# Patient Record
Sex: Female | Born: 1990
Health system: Southern US, Community
[De-identification: ages and names within clinical notes are randomized; demographics above are authoritative.]

## PROBLEM LIST (undated history)

## (undated) DIAGNOSIS — R519 Headache, unspecified: Secondary | ICD-10-CM

## (undated) DIAGNOSIS — F419 Anxiety disorder, unspecified: Secondary | ICD-10-CM

## (undated) DIAGNOSIS — F32A Depression, unspecified: Secondary | ICD-10-CM

## (undated) DIAGNOSIS — M549 Dorsalgia, unspecified: Secondary | ICD-10-CM

## (undated) DIAGNOSIS — E7800 Pure hypercholesterolemia, unspecified: Secondary | ICD-10-CM

## (undated) DIAGNOSIS — F988 Other specified behavioral and emotional disorders with onset usually occurring in childhood and adolescence: Secondary | ICD-10-CM

## (undated) DIAGNOSIS — F329 Major depressive disorder, single episode, unspecified: Secondary | ICD-10-CM

## (undated) DIAGNOSIS — R51 Headache: Secondary | ICD-10-CM

## (undated) HISTORY — DX: Dorsalgia, unspecified: M54.9

## (undated) HISTORY — DX: Anxiety disorder, unspecified: F41.9

## (undated) HISTORY — DX: Other specified behavioral and emotional disorders with onset usually occurring in childhood and adolescence: F98.8

## (undated) HISTORY — DX: Depression, unspecified: F32.A

## (undated) HISTORY — DX: Headache: R51

## (undated) HISTORY — DX: Pure hypercholesterolemia, unspecified: E78.00

## (undated) HISTORY — DX: Headache, unspecified: R51.9

## (undated) HISTORY — PX: TONSILLECTOMY: SUR1361

## (undated) HISTORY — PX: WISDOM TOOTH EXTRACTION: SHX21

---
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
## 2011-11-14 ENCOUNTER — Ambulatory Visit: Payer: BC Managed Care – PPO | Attending: Family Medicine | Admitting: Audiology

## 2011-11-14 DIAGNOSIS — F802 Mixed receptive-expressive language disorder: Secondary | ICD-10-CM | POA: Insufficient documentation
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
## 2012-04-04 ENCOUNTER — Encounter: Payer: Self-pay | Admitting: Internal Medicine

## 2012-04-04 ENCOUNTER — Ambulatory Visit (INDEPENDENT_AMBULATORY_CARE_PROVIDER_SITE_OTHER): Payer: BC Managed Care – PPO | Admitting: Internal Medicine

## 2012-04-04 VITALS — BP 117/77 | HR 69 | Temp 97.8°F | Ht 68.0 in | Wt 175.0 lb

## 2012-04-04 DIAGNOSIS — Z789 Other specified health status: Secondary | ICD-10-CM

## 2012-04-04 DIAGNOSIS — Z23 Encounter for immunization: Secondary | ICD-10-CM

## 2012-04-04 MED ORDER — TYPHOID VACCINE PO CPDR: 1.0000 | DELAYED_RELEASE_CAPSULE | ORAL | Status: DC

## 2012-04-04 NOTE — Progress Notes (Signed)
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 She is unsure of the itinerary if she will be in rural areas. 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  Prior int travel: [place]   No current outpatient prescriptions on file prior to visit.   Active Ambulatory Problems    Diagnosis Date Noted  . No Active Ambulatory Problems   Resolved Ambulatory Problems    Diagnosis Date Noted  . No Resolved Ambulatory Problems   No Additional Past Medical History  - s/p tonsillectomy - s/p right ankle surgery from fracture - s/p wisdom teeth extraction  Review of Systems     Objective:   Physical Exam BP 117/77  Pulse 69  Temp 97.8 F (36.6 C) (Oral)  Ht 5\' 8"  (1.727 m)  Wt 175 lb (79.379 kg)  BMI 26.61 kg/m2  LMP 03/12/2012       Assessment & Plan:  Today: hep a vax #2(previously had received 1 dose in 2006) , and influenza. Will give typhoid vaccine rx. Will check hep a serology to see if she needs booster of 2nd dose in May  Depending on her itinerary, she will consider rabies in may.  Provided pre-travel counseling  Traveller's diarrhea: gave out preventive sheet, will give rx for ciprofloxacin 500mg  BID #10 in May  rtc in early may   Contact info: [email or web address] and ([email or web address]

## 2012-04-05 NOTE — Progress Notes (Signed)
Patient ID: Tamara Meza, female   DOB: 03-10-1991, 21 y.o.   MRN: [id number]  Will have her send [place] her itinerary to determine how many weeks of chloroquine she will need for malaria prophylaxis. Gave precautions to minimize mosquito bites.

## 2012-04-05 NOTE — Progress Notes (Signed)
  Subjective:    Patient ID: Tamara Meza, female    DOB: 17-Dec-1990, 21 y.o.   MRN: [id number]  HPI    Review of Systems     Objective:   Physical Exam        Assessment & Plan:

## 2012-08-28 ENCOUNTER — Ambulatory Visit (INDEPENDENT_AMBULATORY_CARE_PROVIDER_SITE_OTHER): Payer: BC Managed Care – PPO | Admitting: Internal Medicine

## 2012-08-28 ENCOUNTER — Encounter: Payer: Self-pay | Admitting: Internal Medicine

## 2012-08-28 VITALS — BP 118/76 | HR 73 | Temp 98.4°F | Ht 68.0 in | Wt 180.0 lb

## 2012-08-28 DIAGNOSIS — Z789 Other specified health status: Secondary | ICD-10-CM

## 2012-08-28 MED ORDER — CHLOROQUINE PHOSPHATE 250 MG PO TABS: 500.0000 mg | ORAL_TABLET | ORAL | Status: DC

## 2012-08-28 NOTE — Progress Notes (Signed)
[facility] Subjective:    Patient ID: Tamara Meza, female    DOB: 03-09-1991, 22 y.o.   MRN: [id number]  HPI Tamara Meza is Tamara Meza who will be going to [place] ([place], [place]) for 2 months. She previously received her travel vaccines. Here to see if needs 2nd hep A and malaria prophylaxis.  We discussed options, and she is going to go with chloroquine weekly dosing    Review of Systems     Objective:   Physical Exam         Assessment & Plan:  Malaria proph = chloroquine weekly dosing to start 2 wk prior to leaving, weekly thru 4 wks after return  Hep A = will check hep A ab. To see if needs an additional dose

## 2012-08-29 ENCOUNTER — Telehealth: Payer: Self-pay | Admitting: *Deleted

## 2012-08-29 NOTE — Telephone Encounter (Signed)
Called patient, was unavailable, spoke with her mother.  Notified her that the hepatitis titer came back positive, and that her second booster was unnecessary at this time. Pt's mother verbalized understanding. Pt's mother given information regarding the antimalarial prescription; she asked that the physical script along with her vaccination card be given to patient's sister when she comes 08/30/12 for a vaccination.   Andree Coss, RN

## 2013-11-18 ENCOUNTER — Encounter: Payer: Self-pay | Admitting: Obstetrics & Gynecology

## 2013-11-18 ENCOUNTER — Ambulatory Visit (INDEPENDENT_AMBULATORY_CARE_PROVIDER_SITE_OTHER): Payer: BC Managed Care – PPO | Admitting: Obstetrics & Gynecology

## 2013-11-18 VITALS — BP 122/78 | HR 68 | Resp 16 | Ht 68.0 in | Wt 183.8 lb

## 2013-11-18 DIAGNOSIS — Z30011 Encounter for initial prescription of contraceptive pills: Secondary | ICD-10-CM

## 2013-11-18 DIAGNOSIS — Z202 Contact with and (suspected) exposure to infections with a predominantly sexual mode of transmission: Secondary | ICD-10-CM

## 2013-11-18 DIAGNOSIS — Z3009 Encounter for other general counseling and advice on contraception: Secondary | ICD-10-CM

## 2013-11-18 DIAGNOSIS — B009 Herpesviral infection, unspecified: Secondary | ICD-10-CM

## 2013-11-18 MED ORDER — NORETHIN ACE-ETH ESTRAD-FE 1-20 MG-MCG PO TABS: 1.0000 | ORAL_TABLET | Freq: Every day | ORAL | Status: DC

## 2013-11-18 MED ORDER — VALACYCLOVIR HCL 500 MG PO TABS: 500.0000 mg | ORAL_TABLET | Freq: Every day | ORAL | Status: DC

## 2013-11-18 NOTE — Progress Notes (Signed)
Subjective:     Patient ID: Tamara Meza, female   DOB: May 19, 1990, 23 y.o.   MRN: [id number]  HPI 23 yo G0 SWF here to discuss two issues.  First, boyfriend had recurrent facial fever blisters.  Pt had significant outbreak of mouth sores that started with a sore throat 10/15/13.  Symptoms worsened and she out the outbreak of sores in her mouth.  Pt was so uncomfortable she went to Urgent Care on 10/19/13 and followed up with her PCP, Dr. Hyman HopesWebb, on 10/22/13.  This was swabbed and was HSV 1.  Treated with Valtrex 1 gm bid x 10 days.  Pt has not had any vulvar symptoms.    Reports significant other has been HIV tested but not necessarily full STD.  Has been SA x 1 in addition to oral sex.  Contemplating birth control and would like to talk about this.  Feels like pills is the best option for her.  Other methods discussed including Nuva ring, Depo Provera, IUD, and Implanon.   Review of Systems  All other systems reviewed and are negative.      Objective:   Physical Exam  Constitutional: She is oriented to person, place, and time. She appears well-developed and well-nourished.  Neurological: She is alert and oriented to person, place, and time.  Skin: Skin is warm and dry.  Psychiatric: She has a normal mood and affect.       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     Plan:     GC/Chl testing today Valtrex 500mg  daily for suppression  Starting loestrin 1/20 this week.  Directions for use given.  Risks discussed with pt in detail including DUB, DVT/PE, headache, nausea, increased BP.  Instruction sheet provided and reviewed with pt when to start and what to do if misses pill/pills.  Lengthy discussion with pt.  ~30 minutes spent with patient >50% of time was in face to face discussion of above.

## 2013-11-19 LAB — GC/CHLAMYDIA PROBE AMP, URINE
CHLAMYDIA, SWAB/URINE, PCR: NEGATIVE
GC PROBE AMP, URINE: NEGATIVE

## 2013-11-24 ENCOUNTER — Telehealth: Payer: Self-pay

## 2013-11-24 NOTE — Telephone Encounter (Signed)
Lmtcb//kn 

## 2013-11-24 NOTE — Telephone Encounter (Signed)
Message copied by Elisha HeadlandNIX, Lener Ventresca S on Mon Nov 24, 2013  9:27 AM ------      Message from: Jerene BearsMILLER, MARY S      Created: Wed Nov 19, 2013 12:07 PM       Inform GC and Chl are both negative. ------

## 2013-11-25 NOTE — Telephone Encounter (Signed)
Patient notified of all results.//kn 

## 2014-03-11 ENCOUNTER — Encounter: Payer: Self-pay | Admitting: Obstetrics & Gynecology

## 2014-03-11 ENCOUNTER — Ambulatory Visit (INDEPENDENT_AMBULATORY_CARE_PROVIDER_SITE_OTHER): Payer: BC Managed Care – PPO | Admitting: Obstetrics & Gynecology

## 2014-03-11 VITALS — BP 116/64 | HR 80 | Resp 16 | Ht 68.25 in | Wt 188.8 lb

## 2014-03-11 DIAGNOSIS — Z124 Encounter for screening for malignant neoplasm of cervix: Secondary | ICD-10-CM

## 2014-03-11 DIAGNOSIS — Z01419 Encounter for gynecological examination (general) (routine) without abnormal findings: Secondary | ICD-10-CM

## 2014-03-11 MED ORDER — NORETHINDRONE ACET-ETHINYL EST 1.5-30 MG-MCG PO TABS: 1.0000 | ORAL_TABLET | Freq: Every day | ORAL | Status: DC

## 2014-03-11 NOTE — Progress Notes (Signed)
23 y.o. G0P0000 SingleCaucasianF here for annual exam.  Started OCPs in September.  On Junel.  On second week in pack, has bleeding for 5-6 days.  Then on placebo pills will have very light bleeding for about three days.  Cramping is minimal.  Will sometimes have some back pain with cycle but this was present before she started the pill.    STD testing in August was negative.  Has received Gardisil.  Patient's last menstrual period was 03/04/2014.          Sexually active: No.  The current method of family planning is OCP (estrogen/progesterone).    Exercising: Yes.    various Smoker:  no  Health Maintenance: Pap:  none History of abnormal Pap:  no MMG:  none Colonoscopy:  none BMD:   none TDaP:  2010 Screening Labs: not today, Hb today: declined, Urine today: declined   reports that she has never smoked. She has never used smokeless tobacco. She reports that she drinks about 0.6 - 1.2 oz of alcohol per week. She reports that she does not use illicit drugs.  Past Medical History  Diagnosis Date  . Headache     Past Surgical History  Procedure Laterality Date  . Tonsillectomy    . Ankle surgery  2008    right    Current Outpatient Prescriptions  Medication Sig Dispense Refill  . amphetamine-dextroamphetamine (ADDERALL XR) 25 MG 24 hr capsule   0  . norethindrone-ethinyl estradiol (JUNEL FE,GILDESS FE,LOESTRIN FE) 1-20 MG-MCG tablet Take 1 tablet by mouth daily. 1 Package 4  . valACYclovir (VALTREX) 500 MG tablet Take 1 tablet (500 mg total) by mouth daily. 30 tablet 13   No current facility-administered medications for this visit.    Family History  Problem Relation Age of Onset  . Diabetes Father   . Hypertension Father   . Heart disease Mother   . Infertility Paternal Grandmother   . Infertility Paternal Aunt   . Varicose Veins Maternal Grandmother   . Anxiety disorder Mother   . Anxiety disorder Other     maternal side  . Anxiety disorder Paternal Aunt      ROS:  Pertinent items are noted in HPI.  Otherwise, a comprehensive ROS was negative.  Exam:   BP 116/64 mmHg  Pulse 80  Resp 16  Ht 5' 8.25" (1.734 m)  Wt 188 lb 12.8 oz (85.639 kg)  BMI 28.48 kg/m2  LMP 03/04/2014  Weight change: -5#  Height: 5' 8.25" (173.4 cm) (with shoes)  Ht Readings from Last 3 Encounters:  03/11/14 5' 8.25" (1.734 m)  11/18/13 5\' 8"  (1.727 m)  08/28/12 5\' 8"  (1.727 m)    General appearance: alert, cooperative and appears stated age Head: Normocephalic, without obvious abnormality, atraumatic Neck: no adenopathy, supple, symmetrical, trachea midline and thyroid normal to inspection and palpation Lungs: clear to auscultation bilaterally Breasts: normal appearance, no masses or tenderness Heart: regular rate and rhythm Abdomen: soft, non-tender; bowel sounds normal; no masses,  no organomegaly Extremities: extremities normal, atraumatic, no cyanosis or edema Skin: Skin color, texture, turgor normal. No rashes or lesions Lymph nodes: Cervical, supraclavicular, and axillary nodes normal. No abnormal inguinal nodes palpated Neurologic: Grossly normal   Pelvic: External genitalia:  no lesions              Urethra:  normal appearing urethra with no masses, tenderness or lesions              Bartholins and Skenes: normal  Vagina: normal appearing vagina with normal color and discharge, no lesions              Cervix: no lesions              Pap taken: Yes.   Bimanual Exam:  Uterus:  normal size, contour, position, consistency, mobility, non-tender              Adnexa: normal adnexa and no mass, fullness, tenderness               Rectovaginal: Confirms               Anus:  normal sphincter tone, no lesions  A:  Well Woman with normal exam SA with neg STD testing 8/14 H/O HSV DUB with OCPs  P:   Mammogram starting age 23 pap smear today Change OCPs to Loestrin 1.5/30 daily.  Rx to [facility] in [place]. No rx for Valtrex needed. No  STD testing today as no chage in sex partner since 8/15 when last testing was done. return annually or prn  An After Visit Summary was printed and given to the patient.

## 2014-03-18 LAB — IPS PAP TEST WITH REFLEX TO HPV

## 2014-12-18 ENCOUNTER — Other Ambulatory Visit: Payer: Self-pay | Admitting: Obstetrics & Gynecology

## 2014-12-22 NOTE — Telephone Encounter (Signed)
Medication refill request:Valtrex  Last AEX:  03-11-14  Next AEX: 03-22-15 Last MMG (if hormonal medication request): N/A Refill authorized: please advise

## 2015-03-12 ENCOUNTER — Other Ambulatory Visit: Payer: Self-pay | Admitting: Obstetrics & Gynecology

## 2015-03-15 NOTE — Telephone Encounter (Signed)
Medication refill request: Microgestin. Pt on Loestrin  Last AEX:  03/11/14 SM Next AEX: 05/16/16 SM Last MMG (if hormonal medication request): None Refill authorized: 03/11/14 #3packs/4R. Today #3packs/0R?

## 2015-03-22 ENCOUNTER — Ambulatory Visit (INDEPENDENT_AMBULATORY_CARE_PROVIDER_SITE_OTHER): Payer: BLUE CROSS/BLUE SHIELD | Admitting: Obstetrics & Gynecology

## 2015-03-22 ENCOUNTER — Encounter: Payer: Self-pay | Admitting: Obstetrics & Gynecology

## 2015-03-22 VITALS — BP 112/60 | HR 80 | Resp 18 | Ht 68.0 in | Wt 201.0 lb

## 2015-03-22 DIAGNOSIS — N926 Irregular menstruation, unspecified: Secondary | ICD-10-CM | POA: Diagnosis not present

## 2015-03-22 DIAGNOSIS — Z01419 Encounter for gynecological examination (general) (routine) without abnormal findings: Secondary | ICD-10-CM | POA: Diagnosis not present

## 2015-03-22 DIAGNOSIS — B009 Herpesviral infection, unspecified: Secondary | ICD-10-CM | POA: Diagnosis not present

## 2015-03-22 LAB — POCT URINE PREGNANCY: Preg Test, Ur: NEGATIVE

## 2015-03-22 MED ORDER — VALACYCLOVIR HCL 500 MG PO TABS: 500.0000 mg | ORAL_TABLET | Freq: Every day | ORAL | Status: DC

## 2015-03-22 MED ORDER — NORETHINDRONE ACET-ETHINYL EST 1.5-30 MG-MCG PO TABS: 1.0000 | ORAL_TABLET | Freq: Every day | ORAL | Status: DC

## 2015-03-22 NOTE — Progress Notes (Signed)
24 y.o. G0P0000 SingleCaucasianF here for annual exam.  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  Pt has been in a six month training program.  She states, "life sure is different in the real world".  She is commuting from home and working on paying off her Therapist, sports.  Cycles are regular except she states she skipped her cycle in October.   Then she had a normal cycle in November but she's spotted since her period.  She admits she took three days of the 1/20 pills this months.  These were old ones she had "left over".    She is with same partner 2 year.  Had GC/Chl testing 8/15.  Declines additional testing.  Patient's last menstrual period was 03/07/2015 (approximate).          Sexually active: Yes.    The current method of family planning is OCP (estrogen/progesterone).    Exercising: Yes.    Various/ daily or every other day Smoker:  no  Health Maintenance: Pap:  03/11/14 Neg History of abnormal Pap:  no MMG: Never TDaP:  2010  Gardasil Completed 2012 Screening Labs: Declined   reports that she has never smoked. She has never used smokeless tobacco. She reports that she drinks about 0.6 - 1.2 oz of alcohol per week. She reports that she does not use illicit drugs.  Past Medical History  Diagnosis Date  . Headache     Past Surgical History  Procedure Laterality Date  . Tonsillectomy    . Ankle surgery  2008    right    Current Outpatient Prescriptions  Medication Sig Dispense Refill  . amphetamine-dextroamphetamine (ADDERALL) 12.5 MG tablet Take 12.5 mg by mouth 2 (two) times daily.   0  . MICROGESTIN 1.5-30 MG-MCG tablet TAKE 1 TABLET BY MOUTH DAILY 3 Package 0  . valACYclovir (VALTREX) 500 MG tablet TAKE 1 TABLET BY MOUTH EVERY DAY 30 tablet 4   No current facility-administered medications for this visit.    Family History  Problem Relation Age of Onset  . Diabetes Father   . Hypertension Father   .  Heart disease Mother   . Infertility Paternal Grandmother   . Infertility Paternal Aunt   . Varicose Veins Maternal Grandmother   . Anxiety disorder Mother   . Anxiety disorder Other     maternal side  . Anxiety disorder Paternal Aunt     ROS:  Pertinent items are noted in HPI.  Otherwise, a comprehensive ROS was negative.  Exam:   BP 112/60 mmHg  Pulse 80  Resp 18  Ht  (1.727 m)  Wt 201 lb (91.173 kg)  BMI 30.57 kg/m2  LMP 03/07/2015 (Approximate)  Weight change: +13#   Height:  (172.7 cm)  Ht Readings from Last 3 Encounters:  03/22/15  (1.727 m)  03/11/14 5' 8.25" (1.734 m)  11/18/13  (1.727 m)    General appearance: alert, cooperative and appears stated age Head: Normocephalic, without obvious abnormality, atraumatic Neck: no adenopathy, supple, symmetrical, trachea midline and thyroid normal to inspection and palpation Lungs: clear to auscultation bilaterally Breasts: normal appearance, no masses or tenderness Heart: regular rate and rhythm Abdomen: soft, non-tender; bowel sounds normal; no masses,  no organomegaly Extremities: extremities normal, atraumatic, no cyanosis or edema Skin: Skin color, texture, turgor normal. No rashes or lesions Lymph nodes: Cervical, supraclavicular, and axillary nodes normal. No abnormal inguinal nodes palpated Neurologic:  Grossly normal   Pelvic: External genitalia:  no lesions              Urethra:  normal appearing urethra with no masses, tenderness or lesions              Bartholins and Skenes: normal                 Vagina: normal appearing vagina with normal color and discharge, no lesions              Cervix: no lesions              Pap taken: No. Bimanual Exam:  Uterus:  normal size, contour, position, consistency, mobility, non-tender              Adnexa: normal adnexa and no mass, fullness, tenderness               Rectovaginal: Confirms               Anus:  normal sphincter tone, no  lesions  Chaperone was present for exam.  A:  Well Woman with normal exam SA with neg STD testing 8/14 H/O HSV Resolved DUB with OCPs  P: Mammogram starting age 89 pap smear 2015.  No pap today. Declines STD testing as has no partner change in last year. Valtrex 500mg  daily #30/13RF RF for Microgestin 1.5/30 daily.  #30/13 return annually or prn

## 2015-04-27 ENCOUNTER — Telehealth: Payer: Self-pay | Admitting: Obstetrics & Gynecology

## 2015-04-27 NOTE — Telephone Encounter (Signed)
Left message to call Adison Reifsteck at [phone]. 

## 2015-04-27 NOTE — Telephone Encounter (Signed)
Agree with recommendations given.  Ok to close encounter. 

## 2015-04-27 NOTE — Telephone Encounter (Signed)
Patient left message on the voicemail yesterday. She has some questions regarding her birth control.

## 2015-04-27 NOTE — Telephone Encounter (Signed)
Spoke with patient. Patient states that she is currently taking Microgestin for birth control. Patient missed a pill on 04/23/2015. Took two pills on 04/24/2015. Patient started bleeding on 04/24/2015. States bleeding is like her normal period. Denies missing any more pills or taking any pills late since 04/23/2015. Patient is still having bleeding. Advised this is not uncommon with missing a pill. Patient is now in her last week of her pill pack. Patient states she normally takes one week off after completing her pack to have her cycle. Patient is due to take her last pill on 05/01/2015. Patient would like to know if she needs to take a week off or if she can start a new pack immediately since she is currently bleeding. Advised she may start a new pill pack on 05/02/2015. If bleeding persists past 7 days will need to notify the office. Needs to take her pills at the same time daily and not miss any pills. Patient is agreeable. Advised I will send Dr.Miller a message and return call with any further recommendations. Patient is agreeable.

## 2015-11-26 DIAGNOSIS — F900 Attention-deficit hyperactivity disorder, predominantly inattentive type: Secondary | ICD-10-CM | POA: Diagnosis not present

## 2016-03-08 DIAGNOSIS — L720 Epidermal cyst: Secondary | ICD-10-CM | POA: Diagnosis not present

## 2016-03-08 DIAGNOSIS — F900 Attention-deficit hyperactivity disorder, predominantly inattentive type: Secondary | ICD-10-CM | POA: Diagnosis not present

## 2016-03-29 DIAGNOSIS — L720 Epidermal cyst: Secondary | ICD-10-CM | POA: Diagnosis not present

## 2016-05-15 NOTE — Progress Notes (Signed)
26 y.o. G0P0000 SingleCaucasianF here for annual exam.  Doing well.    Move to [place] last month.  Boyfriend is in [place] and he is job Psychologist, educationalsearching.  He is going to do a master's degree but, not yet.    Cycles are regular.  Off OCPs because she and boyfriend broke up but they are back together.  She would like to restart her OCPs.    Patient's last menstrual period was 05/09/2016.          Sexually active: Yes.    The current method of family planning is OCP (estrogen/progesterone).    Exercising: Yes.    cycling, running, weights Smoker:  no  Health Maintenance: Pap:  03/11/14 negative  History of abnormal Pap:  no MMG:  never Colonoscopy:  never BMD:   never TDaP:  2010 Pneumonia vaccine(s):  never Zostavax:   never Hep C testing: not indicated  Screening Labs: declined, Hb today: declined, Urine today: declined   reports that she has never smoked. She has never used smokeless tobacco. She reports that she drinks about 0.6 - 1.2 oz of alcohol per week . She reports that she does not use drugs.  Past Medical History:  Diagnosis Date  . Headache     Past Surgical History:  Procedure Laterality Date  . ANKLE SURGERY  2008   right  . TONSILLECTOMY      Current Outpatient Prescriptions  Medication Sig Dispense Refill  . amphetamine-dextroamphetamine (ADDERALL) 12.5 MG tablet Take 12.5 mg by mouth 2 (two) times daily.   0  . Norethindrone Acetate-Ethinyl Estradiol (MICROGESTIN) 1.5-30 MG-MCG tablet Take 1 tablet by mouth daily. (Patient not taking: Reported on 05/16/2016) 3 Package 4  . valACYclovir (VALTREX) 500 MG tablet Take 1 tablet (500 mg total) by mouth daily. (Patient not taking: Reported on 05/16/2016) 30 tablet 13   No current facility-administered medications for this visit.     Family History  Problem Relation Age of Onset  . Diabetes Father   . Hypertension Father   . Heart disease Mother   . Infertility Paternal Grandmother   . Infertility Paternal  Aunt   . Varicose Veins Maternal Grandmother   . Anxiety disorder Mother   . Anxiety disorder Other     maternal side  . Anxiety disorder Paternal Aunt     ROS:  Pertinent items are noted in HPI.  Otherwise, a comprehensive ROS was negative.  Exam:   BP 112/70 (BP Location: Right Arm, Patient Position: Sitting, Cuff Size: Normal)   Pulse 74   Resp 14   Ht 5\' 9"  (1.753 m)   Wt 172 lb (78 kg)   LMP 05/09/2016   BMI 25.40 kg/m   Weight change: -29#  Height: 5\' 9"  (175.3 cm)  Ht Readings from Last 3 Encounters:  05/16/16 5\' 9"  (1.753 m)  03/22/15 5\' 8"  (1.727 m)  03/11/14 5' 8.25" (1.734 m)    General appearance: alert, cooperative and appears stated age Head: Normocephalic, without obvious abnormality, atraumatic Neck: no adenopathy, supple, symmetrical, trachea midline and thyroid normal to inspection and palpation Lungs: clear to auscultation bilaterally Breasts: normal appearance, no masses or tenderness Heart: regular rate and rhythm Abdomen: soft, non-tender; bowel sounds normal; no masses,  no organomegaly Extremities: extremities normal, atraumatic, no cyanosis or edema Skin: Skin color, texture, turgor normal. No rashes or lesions Lymph nodes: Cervical, supraclavicular, and axillary nodes normal. No abnormal inguinal nodes palpated Neurologic: Grossly normal   Pelvic: External genitalia:  no  lesions              Urethra:  normal appearing urethra with no masses, tenderness or lesions              Bartholins and Skenes: normal                 Vagina: normal appearing vagina with normal color and discharge, no lesions              Cervix: no lesions              Pap taken: Yes.   Bimanual Exam:  Uterus:  normal size, contour, position, consistency, mobility, non-tender              Adnexa: normal adnexa and no mass, fullness, tenderness               Rectovaginal: Confirms               Anus:  normal sphincter tone, no lesions  Chaperone was present for  exam.  A:      Well Woman with normal exam SA, desires to start OCPs H/O HSV  P: Mammogram starting age 4 Pap obtained.  GC/Chl pending Valtrex 500mg  daily #30/1RF.  Pt is not currently taking but would like rx if she decides to restart RF for Microgestin 1.5/30 daily.  #30/13 Return annually or prn

## 2016-05-16 ENCOUNTER — Ambulatory Visit (INDEPENDENT_AMBULATORY_CARE_PROVIDER_SITE_OTHER): Payer: BLUE CROSS/BLUE SHIELD | Admitting: Obstetrics & Gynecology

## 2016-05-16 ENCOUNTER — Encounter: Payer: Self-pay | Admitting: Obstetrics & Gynecology

## 2016-05-16 VITALS — BP 112/70 | HR 74 | Resp 14 | Ht 69.0 in | Wt 172.0 lb

## 2016-05-16 DIAGNOSIS — Z202 Contact with and (suspected) exposure to infections with a predominantly sexual mode of transmission: Secondary | ICD-10-CM | POA: Diagnosis not present

## 2016-05-16 DIAGNOSIS — Z124 Encounter for screening for malignant neoplasm of cervix: Secondary | ICD-10-CM | POA: Diagnosis not present

## 2016-05-16 DIAGNOSIS — Z01419 Encounter for gynecological examination (general) (routine) without abnormal findings: Secondary | ICD-10-CM

## 2016-05-16 DIAGNOSIS — R87610 Atypical squamous cells of undetermined significance on cytologic smear of cervix (ASC-US): Secondary | ICD-10-CM | POA: Diagnosis not present

## 2016-05-16 DIAGNOSIS — Z113 Encounter for screening for infections with a predominantly sexual mode of transmission: Secondary | ICD-10-CM | POA: Diagnosis not present

## 2016-05-16 MED ORDER — NORETHINDRONE ACET-ETHINYL EST 1.5-30 MG-MCG PO TABS: 1.0000 | ORAL_TABLET | Freq: Every day | ORAL | 4 refills | Status: DC

## 2016-05-16 MED ORDER — VALACYCLOVIR HCL 500 MG PO TABS: 500.0000 mg | ORAL_TABLET | Freq: Every day | ORAL | 1 refills | Status: DC

## 2016-05-17 LAB — IPS N GONORRHOEA AND CHLAMYDIA BY PCR

## 2016-05-22 LAB — IPS PAP TEST WITH REFLEX TO HPV

## 2016-05-29 DIAGNOSIS — Z713 Dietary counseling and surveillance: Secondary | ICD-10-CM | POA: Diagnosis not present

## 2016-05-30 ENCOUNTER — Telehealth: Payer: Self-pay | Admitting: *Deleted

## 2016-05-30 NOTE — Telephone Encounter (Signed)
Message left to return call to Tamara Meza at [phone].    

## 2016-05-30 NOTE — Telephone Encounter (Signed)
Patient returning call.

## 2016-05-30 NOTE — Telephone Encounter (Signed)
Returned call to patient. All results reviewed with patient and she verbalized understanding. 02 recall entered- no aex scheduled at this time.    Routing to provider for final review. Patient agreeable to disposition. Will close encounter.

## 2016-05-30 NOTE — Telephone Encounter (Signed)
-----   Message from Jerene BearsMary S Miller, MD sent at 05/29/2016  4:59 PM EST ----- Please call pt.  Pap was ASCUS but HR HPV testing was negative.  Therefore this is a normal pap smear.  02 recall.

## 2016-05-30 NOTE — Telephone Encounter (Signed)
-----   Message from Jerene BearsMary S Miller, MD sent at 05/17/2016  3:33 PM EST ----- Please notify pt that GC/Chl testing is negative.  Can hold for pap result to come back before calling pt.

## 2016-06-26 DIAGNOSIS — Z713 Dietary counseling and surveillance: Secondary | ICD-10-CM | POA: Diagnosis not present

## 2016-09-22 DIAGNOSIS — F900 Attention-deficit hyperactivity disorder, predominantly inattentive type: Secondary | ICD-10-CM | POA: Diagnosis not present

## 2016-10-16 DIAGNOSIS — B009 Herpesviral infection, unspecified: Secondary | ICD-10-CM | POA: Diagnosis not present

## 2017-05-07 DIAGNOSIS — A6000 Herpesviral infection of urogenital system, unspecified: Secondary | ICD-10-CM | POA: Diagnosis not present

## 2017-05-07 DIAGNOSIS — F900 Attention-deficit hyperactivity disorder, predominantly inattentive type: Secondary | ICD-10-CM | POA: Diagnosis not present

## 2017-05-07 DIAGNOSIS — Z3041 Encounter for surveillance of contraceptive pills: Secondary | ICD-10-CM | POA: Diagnosis not present

## 2017-06-19 DIAGNOSIS — F4322 Adjustment disorder with anxiety: Secondary | ICD-10-CM | POA: Diagnosis not present

## 2017-07-23 DIAGNOSIS — F4322 Adjustment disorder with anxiety: Secondary | ICD-10-CM | POA: Diagnosis not present

## 2017-09-21 DIAGNOSIS — F4322 Adjustment disorder with anxiety: Secondary | ICD-10-CM | POA: Diagnosis not present

## 2017-10-26 DIAGNOSIS — Z0000 Encounter for general adult medical examination without abnormal findings: Secondary | ICD-10-CM | POA: Diagnosis not present

## 2017-10-31 DIAGNOSIS — F4322 Adjustment disorder with anxiety: Secondary | ICD-10-CM | POA: Diagnosis not present

## 2017-11-16 DIAGNOSIS — Z3202 Encounter for pregnancy test, result negative: Secondary | ICD-10-CM | POA: Diagnosis not present

## 2017-11-16 DIAGNOSIS — N912 Amenorrhea, unspecified: Secondary | ICD-10-CM | POA: Diagnosis not present

## 2017-11-16 DIAGNOSIS — F411 Generalized anxiety disorder: Secondary | ICD-10-CM | POA: Diagnosis not present

## 2017-11-16 DIAGNOSIS — Z23 Encounter for immunization: Secondary | ICD-10-CM | POA: Diagnosis not present

## 2017-11-16 DIAGNOSIS — F4322 Adjustment disorder with anxiety: Secondary | ICD-10-CM | POA: Diagnosis not present

## 2017-11-30 DIAGNOSIS — F4322 Adjustment disorder with anxiety: Secondary | ICD-10-CM | POA: Diagnosis not present

## 2017-12-14 DIAGNOSIS — F4322 Adjustment disorder with anxiety: Secondary | ICD-10-CM | POA: Diagnosis not present

## 2018-01-04 DIAGNOSIS — F4322 Adjustment disorder with anxiety: Secondary | ICD-10-CM | POA: Diagnosis not present

## 2018-01-13 DIAGNOSIS — Z23 Encounter for immunization: Secondary | ICD-10-CM | POA: Diagnosis not present

## 2018-01-25 ENCOUNTER — Ambulatory Visit: Payer: BLUE CROSS/BLUE SHIELD | Admitting: Psychiatry

## 2018-01-25 DIAGNOSIS — F411 Generalized anxiety disorder: Secondary | ICD-10-CM

## 2018-01-25 NOTE — Progress Notes (Signed)
      [facility] Counselor/Therapist Progress Note   Patient ID: Evonna Stoltz, MRN: [id number]  Date: 01/25/2018  Timespent: 45 minutes  Treatment Type: Individual  Subjective: The client reports that last week she and her boyfriend saw their couples counselor.  "During that session we broke up.  Now I am having second thoughts."  The client states that she and her boyfriend had traveled to [place] from [place] city together.  He then went to the car and drove back to [place] city on his own.  "He was very kind to me. We focused today on reducing the client's anxiety from a suds equals 5 to less than 2 at the end of the session using EMDR.  What came up as the client processed was a fear of disappointing people in a worry that people would be upset with her.  We then made a list of issues for the client to address in premarital counseling.  She believes that she is still interested in the relationship as long as they are able to resolve all of the issues.  These include finances, boundaries, family of origin, conflict resolution, and parenting.   Interventions:Assertiveness Training, Psychosocial Skills: Boundaries, Supportive and Other: EMDR  Mental Status Exam:   Appearance:   Casual     Behavior:  Appropriate  Motor:  Normal  Speech/Language:   Clear and Coherent  Affect:  Appropriate  Mood:  anxious  Thought process:  Coherent  Thought content:    Logical  Perceptual disturbances:    Normal  Orientation:  Full (Time, Place, and Person)  Attention:  Good  Concentration:  good  Memory:  Immediate  Fund of knowledge:   Good  Insight:    Good  Judgment:   Good  Impulse Control:  good    Reported Symptoms: Anxiety   Risk Assessment: Danger to Self:  No Self-injurious Behavior: No Danger to Others: No Duty to Warn:no Physical Aggression / Violence:No  Access to Firearms a concern: No  Gang Involvement:No   Diagnosis:   ICD-10-CM   1. Generalized anxiety  disorder F41.1      Plan: Seek premarital counseling, use list of topics to cover.  Gelene Mink Dmarcus [place], [place]

## 2018-01-30 DIAGNOSIS — F411 Generalized anxiety disorder: Secondary | ICD-10-CM | POA: Diagnosis not present

## 2018-01-30 DIAGNOSIS — Z7184 Encounter for health counseling related to travel: Secondary | ICD-10-CM | POA: Diagnosis not present

## 2018-03-06 DIAGNOSIS — F411 Generalized anxiety disorder: Secondary | ICD-10-CM | POA: Diagnosis not present

## 2018-03-13 ENCOUNTER — Ambulatory Visit: Payer: BLUE CROSS/BLUE SHIELD | Admitting: Psychiatry

## 2018-03-20 DIAGNOSIS — F411 Generalized anxiety disorder: Secondary | ICD-10-CM | POA: Diagnosis not present

## 2018-03-26 DIAGNOSIS — B349 Viral infection, unspecified: Secondary | ICD-10-CM | POA: Diagnosis not present

## 2018-03-26 DIAGNOSIS — R509 Fever, unspecified: Secondary | ICD-10-CM | POA: Diagnosis not present

## 2018-03-29 ENCOUNTER — Ambulatory Visit: Payer: BLUE CROSS/BLUE SHIELD | Admitting: Psychiatry

## 2018-04-03 DIAGNOSIS — F411 Generalized anxiety disorder: Secondary | ICD-10-CM | POA: Diagnosis not present

## 2018-04-24 DIAGNOSIS — F411 Generalized anxiety disorder: Secondary | ICD-10-CM | POA: Diagnosis not present

## 2018-04-26 ENCOUNTER — Ambulatory Visit: Payer: BLUE CROSS/BLUE SHIELD | Admitting: Psychiatry

## 2018-05-03 ENCOUNTER — Ambulatory Visit: Payer: BLUE CROSS/BLUE SHIELD | Admitting: Psychiatry

## 2018-05-03 ENCOUNTER — Encounter: Payer: Self-pay | Admitting: Psychiatry

## 2018-05-03 DIAGNOSIS — F411 Generalized anxiety disorder: Secondary | ICD-10-CM

## 2018-05-03 NOTE — Progress Notes (Signed)
      [facility] Counselor/Therapist Progress Note  Patient ID: Tamara Meza, MRN: [id number],    Date: 05/03/2018  Time Spent: 56 minutes   Treatment Type: Individual Therapy  Reported Symptoms: Anxious Mood  Mental Status Exam:  Appearance:   Casual and Well Groomed     Behavior:  Appropriate  Motor:  Normal  Speech/Language:   Clear and Coherent  Affect:  Appropriate  Mood:  anxious  Thought process:  normal  Thought content:    WNL  Sensory/Perceptual disturbances:    WNL  Orientation:  oriented to person, place, time/date and situation  Attention:  Good  Concentration:  Good  Memory:  WNL  Fund of knowledge:   Good  Insight:    Good  Judgment:   Good  Impulse Control:  Good   Risk Assessment: Danger to Self:  No Self-injurious Behavior: No Danger to Others: No Duty to Warn:no Physical Aggression / Violence:No  Access to Firearms a concern: No  Gang Involvement:No   Subjective: The client states that she and her boyfriend are getting married in [place].  They are currently seeing a couples counselor in [place] city where they live.  She states, "I want my fianc to do EMDR."  Apparently the couples counselor they see is also trained in that process.  The client reports that her anxiety goes up when she fights with her boyfriend.  As she thinks about out her marriage she is anxious about not not being able to lose enough weight for her wedding dress.  She is trying weight watchers without much luck.  We discussed maybe she could just moved to a plant-based diet for period of time.  That way she would not be on a "diet".  She would just think that everything that goes in her mouth needs to be a plant.  She thought this was a great idea and will try to do so.  Interventions: Assertiveness/Communication, Solution-Oriented/Positive Psychology, Eye Movement Desensitization and Reprocessing (EMDR) and Insight-Oriented  Diagnosis:   ICD-10-CM   1. Generalized anxiety  disorder F41.1     Plan: Plan plant-based diet, assertiveness, boundaries  Tamara Meza, [place]

## 2018-05-08 DIAGNOSIS — F411 Generalized anxiety disorder: Secondary | ICD-10-CM | POA: Diagnosis not present

## 2018-05-15 DIAGNOSIS — F411 Generalized anxiety disorder: Secondary | ICD-10-CM | POA: Diagnosis not present

## 2018-05-22 DIAGNOSIS — F411 Generalized anxiety disorder: Secondary | ICD-10-CM | POA: Diagnosis not present

## 2018-05-24 ENCOUNTER — Ambulatory Visit: Payer: BLUE CROSS/BLUE SHIELD | Admitting: Psychiatry

## 2018-05-29 DIAGNOSIS — F411 Generalized anxiety disorder: Secondary | ICD-10-CM | POA: Diagnosis not present

## 2018-06-05 DIAGNOSIS — F411 Generalized anxiety disorder: Secondary | ICD-10-CM | POA: Diagnosis not present

## 2018-06-11 DIAGNOSIS — M9902 Segmental and somatic dysfunction of thoracic region: Secondary | ICD-10-CM | POA: Diagnosis not present

## 2018-06-11 DIAGNOSIS — M542 Cervicalgia: Secondary | ICD-10-CM | POA: Diagnosis not present

## 2018-06-11 DIAGNOSIS — M9901 Segmental and somatic dysfunction of cervical region: Secondary | ICD-10-CM | POA: Diagnosis not present

## 2018-06-11 DIAGNOSIS — M546 Pain in thoracic spine: Secondary | ICD-10-CM | POA: Diagnosis not present

## 2018-06-13 DIAGNOSIS — M546 Pain in thoracic spine: Secondary | ICD-10-CM | POA: Diagnosis not present

## 2018-06-13 DIAGNOSIS — M9901 Segmental and somatic dysfunction of cervical region: Secondary | ICD-10-CM | POA: Diagnosis not present

## 2018-06-13 DIAGNOSIS — M542 Cervicalgia: Secondary | ICD-10-CM | POA: Diagnosis not present

## 2018-06-13 DIAGNOSIS — M9902 Segmental and somatic dysfunction of thoracic region: Secondary | ICD-10-CM | POA: Diagnosis not present

## 2018-06-19 DIAGNOSIS — F411 Generalized anxiety disorder: Secondary | ICD-10-CM | POA: Diagnosis not present

## 2018-07-05 ENCOUNTER — Ambulatory Visit: Payer: BLUE CROSS/BLUE SHIELD | Admitting: Psychiatry

## 2018-07-25 ENCOUNTER — Ambulatory Visit: Payer: BLUE CROSS/BLUE SHIELD | Admitting: Psychiatry

## 2018-08-07 DIAGNOSIS — Z713 Dietary counseling and surveillance: Secondary | ICD-10-CM | POA: Diagnosis not present

## 2018-08-19 ENCOUNTER — Ambulatory Visit (INDEPENDENT_AMBULATORY_CARE_PROVIDER_SITE_OTHER): Payer: BLUE CROSS/BLUE SHIELD | Admitting: Psychiatry

## 2018-08-19 ENCOUNTER — Encounter: Payer: Self-pay | Admitting: Psychiatry

## 2018-08-19 ENCOUNTER — Other Ambulatory Visit: Payer: Self-pay

## 2018-08-19 DIAGNOSIS — F411 Generalized anxiety disorder: Secondary | ICD-10-CM

## 2018-08-19 NOTE — Progress Notes (Signed)
[facility] Counselor/Therapist Progress Note  Patient ID: Melesia Audet, MRN: [id number],    Date: 08/19/2018  Time Spent: 58 minutes   Treatment Type: Individual Therapy  Reported Symptoms: anxiety, uncertainty  Mental Status Exam:  Appearance:   Casual and Well Groomed     Behavior:  Appropriate  Motor:  Normal  Speech/Language:   Clear and Coherent  Affect:  Appropriate  Mood:  anxious  Thought process:  normal  Thought content:    WNL  Sensory/Perceptual disturbances:    WNL  Orientation:  oriented to person, place, time/date and situation  Attention:  Good  Concentration:  Good  Memory:  WNL  Fund of knowledge:   Good  Insight:    Good  Judgment:   Good  Impulse Control:  Good   Risk Assessment: Danger to Self:  No Self-injurious Behavior: No Danger to Others: No Duty to Warn:no Physical Aggression / Violence:No  Access to Firearms a concern: No  Gang Involvement:No   Subjective: Telehealth visit I connected with patient by a video enabled telemedicine/telehealth application or telephone, with her informed consent, and verified patient privacy and that I am speaking with the correct person using two identifiers.  I was located at my office and patient at her home.  We discussed the limitations, risks, and security and privacy concerns associated with telehealth services and the availability of in-person appointments, including awareness that she Treyshaun Keatts be responsible for charges related to the service, and she expressed understanding and agreed to proceed.  I discussed treatment planning with her, with opportunity to ask and answer all questions. Agreed with the plan, demonstrated an understanding of the instructions, and made her aware to call our office if symptoms worsen or she feels she is in a crisis state and needs immediate contact.  The client reports that this past weekend she and her fianc finally broke up.  They were going to get married in [place]  but then delayed until August because of the pandemic.  She states the relationship counselor that they saw in [place], [place] "was terrible."  This particular counselor who uses eye-movement desensitization and reprocessing did not think that her fianc needed that type of treatment.  This puzzled the client because her fianc has a history of suicide attempt, alcoholism and deeply dysfunctional family.  He is anxious depressed and overwhelmed.  This counselor also encouraged the boyfriend to get credit cards to gain mild points to pay for their honeymoon.  The client's fianc is deeply in debt and this is a huge concern for her.  The boyfriend also recently relapsed with alcohol.  After that he got a great sponsor and did his moral inventory which she shared with the client.  He explained to her how much she has been lying to her.  This was the final straw for the client.  She realized she could not wait until next year to see if he got better.  She was concerned that this would be the same cycle over and over again. We discussed the client writing a practical logical essay about why she broke up with the boyfriend so that she have it to refer to when she gets anxious and emotional.  She agreed.  She wrote down 3 practical things of why she broke up.  Interventions: Assertiveness/Communication, Solution-Oriented/Positive Psychology and Insight-Oriented  Diagnosis:   ICD-10-CM   1. Generalized anxiety disorder F41.1     Plan: Journal, exercise, assertiveness, boundaries.  This record has been created using [employer].  Chart creation errors have been sought, but Neviah Braud not always have been located and corrected. Such creation errors do not reflect on the standard of medical care.  Clifford Benninger, [facility]

## 2018-09-03 DIAGNOSIS — M25871 Other specified joint disorders, right ankle and foot: Secondary | ICD-10-CM | POA: Diagnosis not present

## 2018-09-03 DIAGNOSIS — T85848A Pain due to other internal prosthetic devices, implants and grafts, initial encounter: Secondary | ICD-10-CM | POA: Diagnosis not present

## 2018-09-03 DIAGNOSIS — M79671 Pain in right foot: Secondary | ICD-10-CM | POA: Diagnosis not present

## 2018-09-03 DIAGNOSIS — M25571 Pain in right ankle and joints of right foot: Secondary | ICD-10-CM | POA: Diagnosis not present

## 2018-09-18 DIAGNOSIS — Z713 Dietary counseling and surveillance: Secondary | ICD-10-CM | POA: Diagnosis not present

## 2018-09-20 DIAGNOSIS — M9905 Segmental and somatic dysfunction of pelvic region: Secondary | ICD-10-CM | POA: Diagnosis not present

## 2018-09-20 DIAGNOSIS — M9904 Segmental and somatic dysfunction of sacral region: Secondary | ICD-10-CM | POA: Diagnosis not present

## 2018-09-20 DIAGNOSIS — M9902 Segmental and somatic dysfunction of thoracic region: Secondary | ICD-10-CM | POA: Diagnosis not present

## 2018-09-20 DIAGNOSIS — M9903 Segmental and somatic dysfunction of lumbar region: Secondary | ICD-10-CM | POA: Diagnosis not present

## 2018-09-24 ENCOUNTER — Ambulatory Visit: Payer: BC Managed Care – PPO | Admitting: Psychiatry

## 2018-09-24 ENCOUNTER — Encounter: Payer: Self-pay | Admitting: Psychiatry

## 2018-09-24 ENCOUNTER — Other Ambulatory Visit: Payer: Self-pay

## 2018-09-24 DIAGNOSIS — S39012A Strain of muscle, fascia and tendon of lower back, initial encounter: Secondary | ICD-10-CM | POA: Diagnosis not present

## 2018-09-24 DIAGNOSIS — M9905 Segmental and somatic dysfunction of pelvic region: Secondary | ICD-10-CM | POA: Diagnosis not present

## 2018-09-24 DIAGNOSIS — M9903 Segmental and somatic dysfunction of lumbar region: Secondary | ICD-10-CM | POA: Diagnosis not present

## 2018-09-24 DIAGNOSIS — F900 Attention-deficit hyperactivity disorder, predominantly inattentive type: Secondary | ICD-10-CM | POA: Diagnosis not present

## 2018-09-24 DIAGNOSIS — M9904 Segmental and somatic dysfunction of sacral region: Secondary | ICD-10-CM | POA: Diagnosis not present

## 2018-09-24 DIAGNOSIS — F411 Generalized anxiety disorder: Secondary | ICD-10-CM

## 2018-09-24 DIAGNOSIS — M9902 Segmental and somatic dysfunction of thoracic region: Secondary | ICD-10-CM | POA: Diagnosis not present

## 2018-09-24 NOTE — Progress Notes (Signed)
      [facility] Counselor/Therapist Progress Note  Patient ID: Tamara Meza, MRN: [id number],    Date: 09/24/2018  Time Spent: 45 minutes   Treatment Type: Individual Therapy  Reported Symptoms: overwhelmed, tearful, anxious  Mental Status Exam:  Appearance:   Casual and Well Groomed     Behavior:  Appropriate  Motor:  Normal  Speech/Language:   Clear and Coherent  Affect:  Tearful  Mood:  anxious  Thought process:  normal  Thought content:    WNL  Sensory/Perceptual disturbances:    WNL  Orientation:  oriented to person, place, time/date and situation  Attention:  Good  Concentration:  Good  Memory:  WNL  Fund of knowledge:   Good  Insight:    Good  Judgment:   Good  Impulse Control:  Good   Risk Assessment: Danger to Self:  No Self-injurious Behavior: No Danger to Others: No Duty to Warn:no Physical Aggression / Violence:No  Access to Firearms a concern: No  Gang Involvement:No   Subjective: I met with the client face-to-face.  We both had facemasks. "I am all over the place.  A friend's brother unexpectedly die after losing his footing on a waterfall."  The client also reports that she has hurt her lower back and is in a lot of pain rated at, 0-10 with 10 being the most, she is at a 9.5.  She has follow-up appointments today with both her chiropractor and primary care physician. The client feels tearful today and has a sense that things are out of control.  I used eye-movement with the client focusing on that target she felt overwhelmed in her chest, the subjective units of distress is 7.  As she began to process she stated "I am frustrated.  I live in fear."  In the last 6 weeks the client has broken up with her fianc that she has dated since early college.  She has also moved from [facility] into her parents house in [place].  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  She is adjusting to this new  entity.  The more the client processed and talked the higher her back pain became.  I used the bilateral stimulation hand paddles with the client using a mindfulness visualization technique.  She identified the pain in her back as the color red and the color of healing as yellow.  She visualized the color yellow as light flowing over the oval-shaped red pain.  As that happened her pain decreased to less than 4 at the end of the session.  The client will practice this outside of the session.  She was calmer and not tearful at the end of the session.  She felt much more confident.  Interventions: Mindfulness Meditation, Motivational Interviewing, Solution-Oriented/Positive Psychology, [facility] Desensitization and Reprocessing (EMDR) and Insight-Oriented  Diagnosis:   ICD-10-CM   1. Generalized anxiety disorder F41.1     Plan: self care, exercise, mindfulness, boundaries.  This record has been created using [employer].  Chart creation errors have been sought, but Tamara Meza not always have been located and corrected. Such creation errors do not reflect on the standard of medical care.  Tamara Meza, [facility]

## 2018-09-26 DIAGNOSIS — M9902 Segmental and somatic dysfunction of thoracic region: Secondary | ICD-10-CM | POA: Diagnosis not present

## 2018-09-26 DIAGNOSIS — M9905 Segmental and somatic dysfunction of pelvic region: Secondary | ICD-10-CM | POA: Diagnosis not present

## 2018-09-26 DIAGNOSIS — M9903 Segmental and somatic dysfunction of lumbar region: Secondary | ICD-10-CM | POA: Diagnosis not present

## 2018-09-26 DIAGNOSIS — M9904 Segmental and somatic dysfunction of sacral region: Secondary | ICD-10-CM | POA: Diagnosis not present

## 2018-10-04 DIAGNOSIS — M9902 Segmental and somatic dysfunction of thoracic region: Secondary | ICD-10-CM | POA: Diagnosis not present

## 2018-10-04 DIAGNOSIS — M9905 Segmental and somatic dysfunction of pelvic region: Secondary | ICD-10-CM | POA: Diagnosis not present

## 2018-10-04 DIAGNOSIS — M9904 Segmental and somatic dysfunction of sacral region: Secondary | ICD-10-CM | POA: Diagnosis not present

## 2018-10-04 DIAGNOSIS — M9903 Segmental and somatic dysfunction of lumbar region: Secondary | ICD-10-CM | POA: Diagnosis not present

## 2018-10-07 ENCOUNTER — Other Ambulatory Visit: Payer: Self-pay

## 2018-10-07 ENCOUNTER — Ambulatory Visit: Payer: BC Managed Care – PPO | Admitting: Psychiatry

## 2018-10-07 ENCOUNTER — Encounter: Payer: Self-pay | Admitting: Psychiatry

## 2018-10-07 DIAGNOSIS — F411 Generalized anxiety disorder: Secondary | ICD-10-CM

## 2018-10-07 NOTE — Progress Notes (Signed)
      [facility] Counselor/Therapist Progress Note  Patient ID: Tamara Meza, MRN: [id number],    Date: 10/07/2018  Time Spent: 56 minutes   Treatment Type: Individual Therapy  Reported Symptoms: anxious, uncertainty.  Mental Status Exam:  Appearance:   Casual     Behavior:  Appropriate  Motor:  Normal  Speech/Language:   Clear and Coherent  Affect:  Appropriate  Mood:  anxious  Thought process:  normal  Thought content:    WNL  Sensory/Perceptual disturbances:    WNL  Orientation:  oriented to person, place, time/date and situation  Attention:  Good  Concentration:  Good  Memory:  WNL  Fund of knowledge:   Good  Insight:    Good  Judgment:   Good  Impulse Control:  Good   Risk Assessment: Danger to Self:  No Self-injurious Behavior: No Danger to Others: No Duty to Warn:no Physical Aggression / Violence:No  Access to Firearms a concern: No  Gang Involvement:No   Subjective: I met with the client face-to-face.  We both had facemasks. "I feel my life is on hold."  The client continues to stay broken up with her fianc.  She does not see them getting back together.  She is torn about whether she should continue to stay in [place] with her parents or move back to [place] and start her life there.  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  She states that she would love to be a person who organizes things for people.  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  The client's MBTI is sent ESFJ.  This provider personality works well in careers that involve people to people interaction.  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 Client also notices that she is so unmotivated when it comes to her job.  Staying in [place] currently allows the client to save a lot of money versus spending it on rent.  We discussed her  using this 6 months before she returns to [place] to do research on the organization work she wants to do.  I encouraged her to look at [employer] and listen to the podcasts.  The client agrees to do so. I used the bilateral stimulation hand paddles to help the client process through her anxiety from the subjective units of distress of 5 to less than 2 by the end of the session.  Interventions: Motivational Interviewing, Solution-Oriented/Positive Psychology, [employer] Group Desensitization and Reprocessing (EMDR) and Insight-Oriented  Diagnosis:   ICD-10-CM   1. Generalized anxiety disorder  F41.1     Plan: Research organizational jobs, look at other career patterns, self-care, exercise.  This record has been created using [employer].  Chart creation errors have been sought, but Tamara Meza not always have been located and corrected. Such creation errors do not reflect on the standard of medical care.  Tamara Meza, [facility]

## 2018-10-08 ENCOUNTER — Ambulatory Visit: Payer: BLUE CROSS/BLUE SHIELD | Admitting: Psychiatry

## 2018-10-09 DIAGNOSIS — M9903 Segmental and somatic dysfunction of lumbar region: Secondary | ICD-10-CM | POA: Diagnosis not present

## 2018-10-09 DIAGNOSIS — M9904 Segmental and somatic dysfunction of sacral region: Secondary | ICD-10-CM | POA: Diagnosis not present

## 2018-10-09 DIAGNOSIS — M9902 Segmental and somatic dysfunction of thoracic region: Secondary | ICD-10-CM | POA: Diagnosis not present

## 2018-10-09 DIAGNOSIS — M9905 Segmental and somatic dysfunction of pelvic region: Secondary | ICD-10-CM | POA: Diagnosis not present

## 2018-10-22 ENCOUNTER — Encounter: Payer: Self-pay | Admitting: Psychiatry

## 2018-10-22 ENCOUNTER — Other Ambulatory Visit: Payer: Self-pay

## 2018-10-22 ENCOUNTER — Ambulatory Visit: Payer: BC Managed Care – PPO | Admitting: Psychiatry

## 2018-10-22 DIAGNOSIS — F411 Generalized anxiety disorder: Secondary | ICD-10-CM

## 2018-10-22 NOTE — Progress Notes (Signed)
      [facility] Counselor/Therapist Progress Note  Patient ID: Tamara Meza, MRN: [id number],    Date: 10/22/2018  Time Spent: 56 minutes   Treatment Type: Individual Therapy  Reported Symptoms: anxiety  Mental Status Exam:  Appearance:   Casual     Behavior:  Appropriate  Motor:  Normal  Speech/Language:   Clear and Coherent  Affect:  Appropriate  Mood:  anxious  Thought process:  normal  Thought content:    WNL  Sensory/Perceptual disturbances:    WNL  Orientation:  oriented to person, place, time/date and situation  Attention:  Good  Concentration:  Good  Memory:  WNL  Fund of knowledge:   Good  Insight:    Good  Judgment:   Good  Impulse Control:  Good   Risk Assessment: Danger to Self:  No Self-injurious Behavior: No Danger to Others: No Duty to Warn:no Physical Aggression / Violence:No  Access to Firearms a concern: No  Gang Involvement:No   Subjective: I met with the client face-to-face.  We both had facemasks. 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  As the client discussed things today her anxiety increased.  I gave the client the bilateral stimulation hand paddles which helped to calm her down as she spoke.  She is concerned about her anxiety and starting to feel overwhelmed.  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  The client is worried about her anxiety taking over with catastrophic thoughts. We discussed journaling her thoughts as a way of gaining control over them.  We also discussed mindfulness practices.  I also explained to the client the benefit of exercise in reducing her overall stress.  Gave the client a handout called "6 steps to beating depression "which she will review.  The client has also decided to explore bariatric treatment to see if she can get her weight under control.  Interventions: Assertiveness/Communication,  Mindfulness Meditation, Motivational Interviewing, Solution-Oriented/Positive Psychology, [employer] Group Desensitization and Reprocessing (EMDR) and Insight-Oriented  Diagnosis:   ICD-10-CM   1. Generalized anxiety disorder  F41.1     Plan: Review handout, exercise, journaling.  This record has been created using [employer].  Chart creation errors have been sought, but Rodriguez Aguinaldo not always have been located and corrected. Such creation errors do not reflect on the standard of medical care.   Karrine Kluttz, [facility]

## 2018-10-23 DIAGNOSIS — M9905 Segmental and somatic dysfunction of pelvic region: Secondary | ICD-10-CM | POA: Diagnosis not present

## 2018-10-23 DIAGNOSIS — M9904 Segmental and somatic dysfunction of sacral region: Secondary | ICD-10-CM | POA: Diagnosis not present

## 2018-10-23 DIAGNOSIS — M9903 Segmental and somatic dysfunction of lumbar region: Secondary | ICD-10-CM | POA: Diagnosis not present

## 2018-10-23 DIAGNOSIS — M9902 Segmental and somatic dysfunction of thoracic region: Secondary | ICD-10-CM | POA: Diagnosis not present

## 2018-11-20 DIAGNOSIS — M9905 Segmental and somatic dysfunction of pelvic region: Secondary | ICD-10-CM | POA: Diagnosis not present

## 2018-11-20 DIAGNOSIS — M9904 Segmental and somatic dysfunction of sacral region: Secondary | ICD-10-CM | POA: Diagnosis not present

## 2018-11-20 DIAGNOSIS — M9903 Segmental and somatic dysfunction of lumbar region: Secondary | ICD-10-CM | POA: Diagnosis not present

## 2018-11-20 DIAGNOSIS — M9902 Segmental and somatic dysfunction of thoracic region: Secondary | ICD-10-CM | POA: Diagnosis not present

## 2018-11-26 ENCOUNTER — Ambulatory Visit: Payer: BC Managed Care – PPO | Admitting: Psychiatry

## 2018-12-17 ENCOUNTER — Ambulatory Visit (INDEPENDENT_AMBULATORY_CARE_PROVIDER_SITE_OTHER): Payer: BC Managed Care – PPO | Admitting: Psychiatry

## 2018-12-17 ENCOUNTER — Other Ambulatory Visit: Payer: Self-pay

## 2018-12-17 ENCOUNTER — Encounter: Payer: Self-pay | Admitting: Psychiatry

## 2018-12-17 DIAGNOSIS — F411 Generalized anxiety disorder: Secondary | ICD-10-CM

## 2018-12-17 NOTE — Progress Notes (Signed)
[facility] Counselor/Therapist Progress Note  Patient ID: Tamara Meza, MRN: [id number],    Date: 12/17/2018  Time Spent: 57 minutes   Treatment Type: Individual Therapy  Reported Symptoms: anxiety  Mental Status Exam:  Appearance:   Casual     Behavior:  Appropriate  Motor:  Normal  Speech/Language:   Clear and Coherent  Affect:  Appropriate  Mood:  anxious  Thought process:  normal  Thought content:    WNL  Sensory/Perceptual disturbances:    WNL  Orientation:  oriented to person, place, time/date and situation  Attention:  Good  Concentration:  Good  Memory:  WNL  Fund of knowledge:   Good  Insight:    Good  Judgment:   Good  Impulse Control:  Good   Risk Assessment: Danger to Self:  No Self-injurious Behavior: No Danger to Others: No Duty to Warn:no Physical Aggression / Violence:No  Access to Firearms a concern: No  Gang Involvement:No   Subjective: Telehealth visit -- I connected with this patient by an approved telecommunication method (video), with her informed consent, and verifying identity and patient privacy.  I was located at my office and patient at [place]  As needed, we discussed the limitations, risks, and security and privacy concerns associated with telehealth service, including the availability and conditions which currently govern in-person appointments and the possibility that 3rd-party payment Josselin Gaulin not be fully guaranteed and she Jermesha Sottile be responsible for charges.  After she indicated understanding, we proceeded with the session.  Also discussed treatment planning, as needed, including ongoing verbal agreement with the plan, the opportunity to ask and answer all questions, her demonstrated understanding of instructions, and her readiness to call the office should symptoms worsen or she feels she is in a crisis state and needs more immediate and tangible assistance. The client reports she is doing okay but "frustrated".  1 of her very  friends recently became pregnant.  "I feel left behind." The client broke up with her fianc in the last few months.  "I found out we were not on the same page.  He thought we are going to wait a year and not date anyone else and then get back together.  I said no."  The client's boyfriend is in [place] where the client is helping her sister move to a new house.  She agreed to meet with him tomorrow night to talk through what is going on with them.  She is not sure if he is looking for closure. The client is ambivalent about talking to her ex-fianc.  She has been on 2 dates through dating apps.  She was neutral about 1 date and thought the other one was terrible.  It has discouraged her about dating through online.  Her friends continue to encourage her to stay with it.  I asked the client if she was going to tell her ex-fianc that she was dating.  She felt if he asked directly she would comment on it otherwise no.  We discussed what boundaries should be and things she could talk about.  This included what went wrong with the relationship and why she is not comfortable continuing to pursue it.  As the client discussed that she noted that her anxiety began to reduce.  She realized when she was uncertain or was ambivalent that her anxiety would go up.  Having clarification and the focus has been helpful.  Her subjective units of distress went from a 5+  to less than 3.  The client felt like she could do this in hopes to move forward and closed the chapter on this relationship.    Interventions: Assertiveness/Communication, Motivational Interviewing, Solution-Oriented/Positive Psychology and Insight-Oriented  Diagnosis:   ICD-10-CM   1. Generalized anxiety disorder  F41.1     Plan: Boundaries, assertiveness, positive self talk, selective authenticity.  Kilo Eshelman, [facility]

## 2018-12-25 DIAGNOSIS — M9904 Segmental and somatic dysfunction of sacral region: Secondary | ICD-10-CM | POA: Diagnosis not present

## 2018-12-25 DIAGNOSIS — M9902 Segmental and somatic dysfunction of thoracic region: Secondary | ICD-10-CM | POA: Diagnosis not present

## 2018-12-25 DIAGNOSIS — M9903 Segmental and somatic dysfunction of lumbar region: Secondary | ICD-10-CM | POA: Diagnosis not present

## 2018-12-25 DIAGNOSIS — M9905 Segmental and somatic dysfunction of pelvic region: Secondary | ICD-10-CM | POA: Diagnosis not present

## 2019-01-15 DIAGNOSIS — M9904 Segmental and somatic dysfunction of sacral region: Secondary | ICD-10-CM | POA: Diagnosis not present

## 2019-01-15 DIAGNOSIS — M9903 Segmental and somatic dysfunction of lumbar region: Secondary | ICD-10-CM | POA: Diagnosis not present

## 2019-01-15 DIAGNOSIS — M9902 Segmental and somatic dysfunction of thoracic region: Secondary | ICD-10-CM | POA: Diagnosis not present

## 2019-01-15 DIAGNOSIS — M9905 Segmental and somatic dysfunction of pelvic region: Secondary | ICD-10-CM | POA: Diagnosis not present

## 2019-02-05 DIAGNOSIS — M9905 Segmental and somatic dysfunction of pelvic region: Secondary | ICD-10-CM | POA: Diagnosis not present

## 2019-02-05 DIAGNOSIS — M9902 Segmental and somatic dysfunction of thoracic region: Secondary | ICD-10-CM | POA: Diagnosis not present

## 2019-02-05 DIAGNOSIS — M9903 Segmental and somatic dysfunction of lumbar region: Secondary | ICD-10-CM | POA: Diagnosis not present

## 2019-02-05 DIAGNOSIS — M9904 Segmental and somatic dysfunction of sacral region: Secondary | ICD-10-CM | POA: Diagnosis not present

## 2019-02-18 ENCOUNTER — Other Ambulatory Visit: Payer: Self-pay

## 2019-02-18 ENCOUNTER — Encounter (INDEPENDENT_AMBULATORY_CARE_PROVIDER_SITE_OTHER): Payer: Self-pay | Admitting: Family Medicine

## 2019-02-18 ENCOUNTER — Ambulatory Visit (INDEPENDENT_AMBULATORY_CARE_PROVIDER_SITE_OTHER): Payer: BC Managed Care – PPO | Admitting: Family Medicine

## 2019-02-18 VITALS — BP 107/72 | HR 89 | Temp 98.1°F | Ht 68.0 in | Wt 234.0 lb

## 2019-02-18 DIAGNOSIS — Z9189 Other specified personal risk factors, not elsewhere classified: Secondary | ICD-10-CM

## 2019-02-18 DIAGNOSIS — E7849 Other hyperlipidemia: Secondary | ICD-10-CM

## 2019-02-18 DIAGNOSIS — E66812 Obesity, class 2: Secondary | ICD-10-CM

## 2019-02-18 DIAGNOSIS — R0602 Shortness of breath: Secondary | ICD-10-CM

## 2019-02-18 DIAGNOSIS — F419 Anxiety disorder, unspecified: Secondary | ICD-10-CM | POA: Diagnosis not present

## 2019-02-18 DIAGNOSIS — Z1331 Encounter for screening for depression: Secondary | ICD-10-CM | POA: Diagnosis not present

## 2019-02-18 DIAGNOSIS — R5383 Other fatigue: Secondary | ICD-10-CM | POA: Diagnosis not present

## 2019-02-18 DIAGNOSIS — Z6835 Body mass index (BMI) 35.0-35.9, adult: Secondary | ICD-10-CM

## 2019-02-18 DIAGNOSIS — Z0289 Encounter for other administrative examinations: Secondary | ICD-10-CM

## 2019-02-19 LAB — TSH: TSH: 3.54 u[IU]/mL (ref 0.450–4.500)

## 2019-02-19 LAB — CBC WITH DIFFERENTIAL/PLATELET
Basophils Absolute: 0.1 10*3/uL (ref 0.0–0.2)
Basos: 1 %
EOS (ABSOLUTE): 0.2 10*3/uL (ref 0.0–0.4)
Eos: 2 %
Hematocrit: 42.1 % (ref 34.0–46.6)
Hemoglobin: 13.9 g/dL (ref 11.1–15.9)
Immature Grans (Abs): 0.1 10*3/uL (ref 0.0–0.1)
Immature Granulocytes: 1 %
Lymphocytes Absolute: 1.8 10*3/uL (ref 0.7–3.1)
Lymphs: 20 %
MCH: 28.9 pg (ref 26.6–33.0)
MCHC: 33 g/dL (ref 31.5–35.7)
MCV: 88 fL (ref 79–97)
Monocytes Absolute: 0.7 10*3/uL (ref 0.1–0.9)
Monocytes: 7 %
Neutrophils Absolute: 6.2 10*3/uL (ref 1.4–7.0)
Neutrophils: 69 %
Platelets: 266 10*3/uL (ref 150–450)
RBC: 4.81 x10E6/uL (ref 3.77–5.28)
RDW: 12.6 % (ref 11.7–15.4)
WBC: 8.9 10*3/uL (ref 3.4–10.8)

## 2019-02-19 LAB — COMPREHENSIVE METABOLIC PANEL
ALT: 23 IU/L (ref 0–32)
AST: 24 IU/L (ref 0–40)
Albumin/Globulin Ratio: 2 (ref 1.2–2.2)
Albumin: 4.6 g/dL (ref 3.9–5.0)
Alkaline Phosphatase: 77 IU/L (ref 39–117)
BUN/Creatinine Ratio: 13 (ref 9–23)
BUN: 9 mg/dL (ref 6–20)
Bilirubin Total: 0.4 mg/dL (ref 0.0–1.2)
CO2: 20 mmol/L (ref 20–29)
Calcium: 9.8 mg/dL (ref 8.7–10.2)
Chloride: 103 mmol/L (ref 96–106)
Creatinine, Ser: 0.7 mg/dL (ref 0.57–1.00)
GFR calc Af Amer: 137 mL/min/{1.73_m2} (ref >59)
GFR calc non Af Amer: 119 mL/min/{1.73_m2} (ref >59)
Globulin, Total: 2.3 g/dL (ref 1.5–4.5)
Glucose: 80 mg/dL (ref 65–99)
Potassium: 4.2 mmol/L (ref 3.5–5.2)
Sodium: 138 mmol/L (ref 134–144)
Total Protein: 6.9 g/dL (ref 6.0–8.5)

## 2019-02-19 LAB — FOLATE: Folate: 8.4 ng/mL (ref >3.0)

## 2019-02-19 LAB — VITAMIN B12: Vitamin B-12: 307 pg/mL (ref 232–1245)

## 2019-02-19 LAB — LIPID PANEL WITH LDL/HDL RATIO
Cholesterol, Total: 237 mg/dL — ABNORMAL HIGH (ref 100–199)
HDL: 64 mg/dL (ref >39)
LDL Chol Calc (NIH): 136 mg/dL — ABNORMAL HIGH (ref 0–99)
LDL/HDL Ratio: 2.1 ratio (ref 0.0–3.2)
Triglycerides: 208 mg/dL — ABNORMAL HIGH (ref 0–149)
VLDL Cholesterol Cal: 37 mg/dL (ref 5–40)

## 2019-02-19 LAB — HEMOGLOBIN A1C
Est. average glucose Bld gHb Est-mCnc: 114 mg/dL
Hgb A1c MFr Bld: 5.6 % (ref 4.8–5.6)

## 2019-02-19 LAB — T3: T3, Total: 130 ng/dL (ref 71–180)

## 2019-02-19 LAB — INSULIN, RANDOM: INSULIN: 10.7 u[IU]/mL (ref 2.6–24.9)

## 2019-02-19 LAB — VITAMIN D 25 HYDROXY (VIT D DEFICIENCY, FRACTURES): Vit D, 25-Hydroxy: 26.2 ng/mL — ABNORMAL LOW (ref 30.0–100.0)

## 2019-02-19 LAB — T4, FREE: Free T4: 0.88 ng/dL (ref 0.82–1.77)

## 2019-02-19 NOTE — Progress Notes (Signed)
Office: [phone]  /  Fax: [phone]   HPI:   Chief Complaint: OBESITY  Tamara Meza (MR# [id number]) is a 28 y.o. female who presents on 02/18/2019 for obesity evaluation and treatment. Current BMI is Body mass index is 35.58 kg/m. Tamara Meza has struggled with obesity for years and has been unsuccessful in either losing weight or maintaining long term weight loss. Tamara Meza attended our information session and states she is currently in the action stage of change and ready to dedicate time achieving and maintaining a healthier weight.   Tamara Meza states her family eats meals together her desired weight loss is 79 lbs she has been heavy most of  her life (since 3rd grade) she started gaining weight in February 2019 her heaviest weight ever was 240 lbs she has significant food cravings issues  she snacks frequently in the evenings she is frequently drinking liquids with calories she frequently makes poor food choices she frequently eats larger portions than normal  she has binge eating behaviors she struggles with emotional eating    Fatigue Tamara Meza feels her energy is lower than it should be. This has worsened with weight gain and has not worsened recently. Tamara Meza admits to daytime somnolence and  admits to waking up still tired. Patient is at risk for obstructive sleep apnea. Patent has a history of symptoms of daytime fatigue. Patient generally gets 8 hours of sleep per night, and states they generally have generally restful sleep. Snoring is not present. Apneic episodes are not present. Epworth Sleepiness Score is 6.  Dyspnea on exertion Tamara Meza notes increasing shortness of breath with exercising and seems to be worsening over time with weight gain. She notes getting out of breath sooner with activity than she used to. This has not gotten worse recently. Tamara Meza denies orthopnea.  Hyperlipidemia Tamara Meza has a diagnosis of hyperlipidemia. She is not on statin and has no recent labs. She  is attempting to improve her cholesterol levels with intensive lifestyle modification including a low saturated fat diet, exercise and weight loss. She denies any chest pain, claudication or myalgias.  At risk for cardiovascular disease Tamara Meza is at a higher than average risk for cardiovascular disease due to obesity and hyperlipidemia  Anxiety Tamara Meza is on Zoloft and is seeing behavorial health on and off. She knows she does emotional eating and is working on this. She denies suicidal ideas or homicidal ideas.  Depression Screen Tamara Meza's Food and Mood (modified PHQ-9) score was  Depression screen PHQ 2/9 02/18/2019  Decreased Interest 1  Down, Depressed, Hopeless 3  PHQ - 2 Score 4  Altered sleeping 0  Tired, decreased energy 1  Change in appetite 2  Feeling bad or failure about yourself  0  Trouble concentrating 1  Moving slowly or fidgety/restless 0  Suicidal thoughts 0  PHQ-9 Score 8  Difficult doing work/chores Not difficult at all    ASSESSMENT AND PLAN:  Other fatigue - Plan: EKG 12-Lead, CBC with Differential/Platelet, Comprehensive metabolic panel, Hemoglobin A1c, Insulin, random, VITAMIN D 25 Hydroxy (Vit-D Deficiency, Fractures), Vitamin B12, Folate, T3, T4, free, TSH  SOB (shortness of breath) on exertion - Plan: Lipid Panel With LDL/HDL Ratio  Other hyperlipidemia  Anxiety  Depression screening  At risk for heart disease  Class 2 severe obesity with serious comorbidity and body mass index (BMI) of 35.0 to 35.9 in adult, unspecified obesity type (HCC)  PLAN:  Fatigue Tamara Meza was informed that her fatigue may be related to obesity, depression or many other  causes. Labs will be ordered, and in the meanwhile Tamara Meza has agreed to work on diet, exercise and weight loss to help with fatigue. Proper sleep hygiene was discussed including the need for 7-8 hours of quality sleep each night. A sleep study was not ordered based on symptoms and Epworth score.  Dyspnea on  exertion Tamara Meza's shortness of breath appears to be obesity related and exercise induced. She has agreed to work on weight loss and gradually increase exercise to treat her exercise induced shortness of breath. If Tamara Meza follows our instructions and loses weight without improvement of her shortness of breath, we will plan to refer to pulmonology. We will monitor this condition regularly. Tamara Meza agrees to this plan.  Hyperlipidemia Tamara Meza was informed of the American Heart Association Guidelines emphasizing intensive lifestyle modifications as the first line treatment for hyperlipidemia. We discussed many lifestyle modifications today in depth. Tamara Meza will start her diet, and continue to work on decreasing saturated fats such as fatty red meat, butter and many fried foods. She will also increase vegetables and lean protein in her diet and continue to work on exercise and weight loss efforts. We will check labs today. Tamara Meza agrees to follow up with our clinic in 2 weeks.  Cardiovascular risk counseling Tamara Meza was given extended (15 minutes) coronary artery disease prevention counseling today. She is 28 y.o. female and has risk factors for heart disease including obesity and hyperlipidemia. We discussed intensive lifestyle modifications today with an emphasis on specific weight loss instructions and strategies. Pt was also informed of the importance of increasing exercise and decreasing saturated fats to help prevent heart disease.  Anxiety Tamara Meza agrees to continue taking Zoloft, and she is to follow up with behavorial health. Tamara Meza agrees to follow up with our clinic in 2 weeks.  Depression Screen Tamara Meza had a mildly positive depression screening. Depression is commonly associated with obesity and often results in emotional eating behaviors. We will monitor this closely and work on CBT to help improve the non-hunger eating patterns. Referral to Psychology may be required if no improvement is seen as she  continues in our clinic.  Obesity Tamara Meza is currently in the action stage of change and her goal is to continue with weight loss efforts She has agreed to follow the Category 3 plan Tamara Meza has been instructed to work up to a goal of 150 minutes of combined cardio and strengthening exercise per week for weight loss and overall health benefits. We discussed the following Behavioral Modification Strategies today: increasing lean protein intake, decreasing simple carbohydrates  and emotional eating strategies  Tamara Meza has agreed to follow up with our clinic in 2 weeks. She was informed of the importance of frequent follow up visits to maximize her success with intensive lifestyle modifications for her multiple health conditions. She was informed we would discuss her lab results at her next visit unless there is a critical issue that needs to be addressed sooner. Tamara Meza agreed to keep her next visit at the agreed upon time to discuss these results.  ALLERGIES: Allergies  Allergen Reactions   Codeine     hives   Sulfa Antibiotics     Hives-? If allergy, patient states does not remember having this allergy    MEDICATIONS: Current Outpatient Medications on File Prior to Visit  Medication Sig Dispense Refill   amphetamine-dextroamphetamine (ADDERALL) 10 MG tablet Take 10 mg by mouth 2 (two) times daily.   0   sertraline (ZOLOFT) 100 MG tablet Take 100 mg by  mouth daily.     valACYclovir (VALTREX) 500 MG tablet Take 1 tablet (500 mg total) by mouth daily. 30 tablet 1   No current facility-administered medications on file prior to visit.     PAST MEDICAL HISTORY: Past Medical History:  Diagnosis Date   ADD (attention deficit disorder)    Anxiety    Back pain    Depression    Elevated cholesterol    Headache     PAST SURGICAL HISTORY: Past Surgical History:  Procedure Laterality Date   ANKLE SURGERY  2008   right   TONSILLECTOMY     WISDOM TOOTH EXTRACTION      SOCIAL  HISTORY: Social History   Tobacco Use   Smoking status: Never Smoker   Smokeless tobacco: Never Used  Substance Use Topics   Alcohol use: Yes    Alcohol/week: 1.0 - 2.0 standard drinks    Types: 1 - 2 Standard drinks or equivalent per week   Drug use: No    FAMILY HISTORY: Family History  Problem Relation Age of Onset   Diabetes Father    Hypertension Father    Sleep apnea Father    Heart disease Mother    Anxiety disorder Mother    Depression Mother    Sleep apnea Mother    Obesity Mother    Infertility Paternal Grandmother    Infertility Paternal Aunt    Varicose Veins Maternal Grandmother    Anxiety disorder Other        maternal side   Anxiety disorder Paternal Aunt     ROS: Review of Systems  Constitutional: Positive for malaise/fatigue. Negative for weight loss.  Cardiovascular: Negative for chest pain, orthopnea and claudication.  Musculoskeletal: Positive for back pain. Negative for myalgias.  Psychiatric/Behavioral: Positive for depression. Negative for suicidal ideas.       + Anxiety + Stress    PHYSICAL EXAM: Blood pressure 107/72, pulse 89, temperature 98.1 F (36.7 C), temperature source Oral, height 5\' 8"  (1.727 m), weight 234 lb (106.1 kg), last menstrual period 11/18/2018, SpO2 99 %. Body mass index is 35.58 kg/m. Physical Exam Vitals signs reviewed.  Constitutional:      Appearance: Normal appearance. She is obese.  HENT:     Head: Normocephalic and atraumatic.     Nose: Nose normal.  Eyes:     General: No scleral icterus.    Extraocular Movements: Extraocular movements intact.  Neck:     Musculoskeletal: Normal range of motion and neck supple.     Comments: No thyromegaly present Cardiovascular:     Rate and Rhythm: Normal rate and regular rhythm.     Pulses: Normal pulses.     Heart sounds: Normal heart sounds.  Pulmonary:     Effort: Pulmonary effort is normal. No respiratory distress.     Breath sounds: Normal  breath sounds.  Abdominal:     Palpations: Abdomen is soft.     Tenderness: There is no abdominal tenderness.     Comments: + Obesity  Musculoskeletal: Normal range of motion.     Right lower leg: No edema.     Left lower leg: No edema.  Skin:    General: Skin is warm and dry.  Neurological:     Mental Status: She is alert and oriented to person, place, and time.     Coordination: Coordination normal.  Psychiatric:        Mood and Affect: Mood normal.        Behavior: Behavior normal.  RECENT LABS AND TESTS: BMET    Component Value Date/Time   NA 138 02/18/2019 0905   K 4.2 02/18/2019 0905   CL 103 02/18/2019 0905   CO2 20 02/18/2019 0905   GLUCOSE 80 02/18/2019 0905   BUN 9 02/18/2019 0905   CREATININE 0.70 02/18/2019 0905   CALCIUM 9.8 02/18/2019 0905   GFRNONAA 119 02/18/2019 0905   GFRAA 137 02/18/2019 0905   Lab Results  Component Value Date   HGBA1C 5.6 02/18/2019   Lab Results  Component Value Date   INSULIN 10.7 02/18/2019   CBC    Component Value Date/Time   WBC 8.9 02/18/2019 0905   RBC 4.81 02/18/2019 0905   HGB 13.9 02/18/2019 0905   HCT 42.1 02/18/2019 0905   PLT 266 02/18/2019 0905   MCV 88 02/18/2019 0905   MCH 28.9 02/18/2019 0905   MCHC 33.0 02/18/2019 0905   RDW 12.6 02/18/2019 0905   LYMPHSABS 1.8 02/18/2019 0905   EOSABS 0.2 02/18/2019 0905   BASOSABS 0.1 02/18/2019 0905   Iron/TIBC/Ferritin/ %Sat No results found for: IRON, TIBC, FERRITIN, IRONPCTSAT Lipid Panel     Component Value Date/Time   CHOL 237 (H) 02/18/2019 0905   TRIG 208 (H) 02/18/2019 0905   HDL 64 02/18/2019 0905   LDLCALC 136 (H) 02/18/2019 0905   Hepatic Function Panel     Component Value Date/Time   PROT 6.9 02/18/2019 0905   ALBUMIN 4.6 02/18/2019 0905   AST 24 02/18/2019 0905   ALT 23 02/18/2019 0905   ALKPHOS 77 02/18/2019 0905   BILITOT 0.4 02/18/2019 0905      Component Value Date/Time   TSH 3.540 02/18/2019 0905   Vitamin D No recent  labs  ECG  shows NSR with a rate of 95 BPM INDIRECT CALORIMETER done today shows a VO2 of 408 and a REE of 2843. Her calculated basal metabolic rate is 6433 thus her basal metabolic rate is better than expected.       OBESITY BEHAVIORAL INTERVENTION VISIT  Today's visit was # 1   Starting weight: 234 lbs Starting date: 02/18/2019 Today's weight : 234 lbs Today's date: 02/18/2019 Total lbs lost to date: 0    ASK: We discussed the diagnosis of obesity with Tamara Meza today and Tamara Meza agreed to give [place] permission to discuss obesity behavioral modification therapy today.  ASSESS: Tamara Meza has the diagnosis of obesity and her BMI today is 35.59 Tamara Meza is in the action stage of change   ADVISE: Tamara Meza was educated on the multiple health risks of obesity as well as the benefit of weight loss to improve her health. She was advised of the need for long term treatment and the importance of lifestyle modifications to improve her current health and to decrease her risk of future health problems.  AGREE: Multiple dietary modification options and treatment options were discussed and  [place] agreed to follow the recommendations documented in the above note.  ARRANGE: Tamara Meza was educated on the importance of frequent visits to treat obesity as outlined per CMS and USPSTF guidelines and agreed to schedule her next follow up appointment today.   I, Burt Knack, am acting as transcriptionist for Quillian Quince, MD  I have reviewed the above documentation for accuracy and completeness, and I agree with the above. -Quillian Quince, MD

## 2019-03-04 ENCOUNTER — Encounter (INDEPENDENT_AMBULATORY_CARE_PROVIDER_SITE_OTHER): Payer: Self-pay | Admitting: Family Medicine

## 2019-03-04 ENCOUNTER — Other Ambulatory Visit: Payer: Self-pay

## 2019-03-04 ENCOUNTER — Ambulatory Visit (INDEPENDENT_AMBULATORY_CARE_PROVIDER_SITE_OTHER): Payer: BC Managed Care – PPO | Admitting: Family Medicine

## 2019-03-04 VITALS — BP 104/70 | HR 83 | Temp 97.8°F | Ht 68.0 in | Wt 223.0 lb

## 2019-03-04 DIAGNOSIS — Z9189 Other specified personal risk factors, not elsewhere classified: Secondary | ICD-10-CM | POA: Diagnosis not present

## 2019-03-04 DIAGNOSIS — E559 Vitamin D deficiency, unspecified: Secondary | ICD-10-CM

## 2019-03-04 DIAGNOSIS — E8881 Metabolic syndrome: Secondary | ICD-10-CM

## 2019-03-04 DIAGNOSIS — E782 Mixed hyperlipidemia: Secondary | ICD-10-CM

## 2019-03-04 DIAGNOSIS — E669 Obesity, unspecified: Secondary | ICD-10-CM

## 2019-03-04 DIAGNOSIS — Z6834 Body mass index (BMI) 34.0-34.9, adult: Secondary | ICD-10-CM

## 2019-03-04 MED ORDER — VITAMIN D (ERGOCALCIFEROL) 1.25 MG (50000 UNIT) PO CAPS: 50000.0000 [IU] | ORAL_CAPSULE | ORAL | 0 refills | Status: DC

## 2019-03-04 MED ORDER — METFORMIN HCL 500 MG PO TABS: 500.0000 mg | ORAL_TABLET | Freq: Every day | ORAL | 0 refills | Status: DC

## 2019-03-05 NOTE — Progress Notes (Signed)
Office: [phone]  /  Fax: [phone]   HPI:   Chief Complaint: OBESITY Tamara Meza is here to discuss her progress with her obesity treatment plan. She is on the Category 3 plan and is following her eating plan approximately 98% of the time. She states she is walking, Barre classes, and exercising on the elliptical 45-50 minutes 3-5 times per week. Tamara Meza did very well with her diet prescription. She reports hunger was mostly controlled. She has questions about Thanksgiving.  Her weight is 223 lb (101.2 kg) today and has had a weight loss of 11 pounds over a period of 2 weeks since her last visit. She has lost 11 lbs since starting treatment with Tamara Meza.  Hyperlipidemia, Mixed [name] has mixed hyperlipidemia and has been trying to improve her cholesterol levels with intensive lifestyle modification including a low saturated fat diet, exercise and weight loss. LDL and triglycerides were elevated and HDL was low. She is not on a statin and would like to improve on diet. She denies any chest pain, claudication or myalgias.  Vitamin D deficiency Tamara Meza has a new diagnosis of Vitamin D deficiency. She is currently not taking Vit D and denies nausea, vomiting or muscle weakness but admits to fatigue.  Insulin Resistance Tamara Meza has a new diagnosis of insulin resistance based on her elevated fasting insulin level of 10.7. Although Tamara Meza's blood glucose readings are still under good control, insulin resistance puts her at greater risk of metabolic syndrome and diabetes. She is not taking metformin currently and continues to work on diet and exercise to decrease risk of diabetes. She does report polyphagia.  At risk for diabetes Tamara Meza is at higher than average risk for developing diabetes due to her obesity. She currently denies polyuria or polydipsia.  ASSESSMENT AND PLAN:  Mixed hyperlipidemia  Vitamin D deficiency - Plan: Vitamin D, Ergocalciferol, (DRISDOL) 1.25 MG (50000 UT) CAPS capsule  Insulin  resistance - Plan: metFORMIN (GLUCOPHAGE) 500 MG tablet  At risk for diabetes mellitus  Class 1 obesity with serious comorbidity and body mass index (BMI) of 34.0 to 34.9 in adult, unspecified obesity type  PLAN:  Hyperlipidemia, Mixed [name] was informed of the American Heart Association Guidelines emphasizing intensive lifestyle modifications as the first line treatment for hyperlipidemia. We discussed many lifestyle modifications today in depth, and [name] will continue to work on decreasing saturated fats such as fatty red meat, butter and many fried foods. She will also increase vegetables and lean protein in her diet and continue to work on exercise and weight loss efforts. Will recheck labs in 3 months.  Vitamin D Deficiency Tamara Meza was informed that low Vitamin D levels contributes to fatigue and are associated with obesity, breast, and colon cancer. She agrees to start prescription Vit D @ 50,000 IU every week #4 with 0 refills and will follow-up for routine testing of Vitamin D, at least 2-3 times per year. She was informed of the risk of over-replacement of Vitamin D and agrees to not increase her dose unless she discusses this with Tamara Meza first. Tamara Meza agrees to follow-up with our clinic in 2 weeks.  Insulin Resistance Tamara Meza will continue to work on weight loss, exercise, and decreasing simple carbohydrates in her diet to help decrease the risk of diabetes. We dicussed metformin including benefits and risks. She was informed that eating too many simple carbohydrates or too many calories at one sitting increases the likelihood of GI side effects. Tamara Meza will start metformin 500 mg QAM #30 with 0 refills  and agrees to follow-up with our clinic in 2 weeks. She will continue diet and exercise.  Diabetes risk counseling Tamara Meza was given extended (30 minutes) diabetes prevention counseling today. She is 28 y.o. female and has risk factors for diabetes including obesity. We discussed intensive lifestyle  modifications today with an emphasis on weight loss as well as increasing exercise and decreasing simple carbohydrates in her diet.  Obesity Tamara Meza is currently in the action stage of change. As such, her goal is to continue with weight loss efforts. She has agreed to follow the Category 3 plan. Tamara Meza has been instructed to work up to a goal of 150 minutes of combined cardio and strengthening exercise per week for weight loss and overall health benefits. We discussed the following Behavioral Modification Strategies today: increasing lean protein intake, decreasing simple carbohydrates, and holiday eating strategies.  Tamara Meza has agreed to follow-up with our clinic in 2 weeks. She was informed of the importance of frequent follow-up visits to maximize her success with intensive lifestyle modifications for her multiple health conditions.  ALLERGIES: Allergies  Allergen Reactions  . Codeine     hives  . Sulfa Antibiotics     Hives-? If allergy, patient states does not remember having this allergy    MEDICATIONS: Current Outpatient Medications on File Prior to Visit  Medication Sig Dispense Refill  . amphetamine-dextroamphetamine (ADDERALL) 10 MG tablet Take 10 mg by mouth 2 (two) times daily.   0  . sertraline (ZOLOFT) 100 MG tablet Take 100 mg by mouth daily.    . valACYclovir (VALTREX) 500 MG tablet Take 1 tablet (500 mg total) by mouth daily. 30 tablet 1   No current facility-administered medications on file prior to visit.     PAST MEDICAL HISTORY: Past Medical History:  Diagnosis Date  . ADD (attention deficit disorder)   . Anxiety   . Back pain   . Depression   . Elevated cholesterol   . Headache     PAST SURGICAL HISTORY: Past Surgical History:  Procedure Laterality Date  . ANKLE SURGERY  2008   right  . TONSILLECTOMY    . WISDOM TOOTH EXTRACTION      SOCIAL HISTORY: Social History   Tobacco Use  . Smoking status: Never Smoker  . Smokeless tobacco: Never Used   Substance Use Topics  . Alcohol use: Yes    Alcohol/week: 1.0 - 2.0 standard drinks    Types: 1 - 2 Standard drinks or equivalent per week  . Drug use: No    FAMILY HISTORY: Family History  Problem Relation Age of Onset  . Diabetes Father   . Hypertension Father   . Sleep apnea Father   . Heart disease Mother   . Anxiety disorder Mother   . Depression Mother   . Sleep apnea Mother   . Obesity Mother   . Infertility Paternal Grandmother   . Infertility Paternal Aunt   . Varicose Veins Maternal Grandmother   . Anxiety disorder Other        maternal side  . Anxiety disorder Paternal Aunt    ROS: Review of Systems  Constitutional: Positive for malaise/fatigue.  Cardiovascular: Negative for chest pain and claudication.  Gastrointestinal: Negative for nausea and vomiting.  Musculoskeletal: Negative for myalgias.       Negative for muscle weakness.  Endo/Heme/Allergies:       Positive for polyphagia.   PHYSICAL EXAM: Blood pressure 104/70, pulse 83, temperature 97.8 F (36.6 C), temperature source Oral, height  (  1.727 m), weight 223 lb (101.2 kg), last menstrual period 02/24/2019, SpO2 100 %. Body mass index is 33.91 kg/m. Physical Exam Vitals signs reviewed.  Constitutional:      Appearance: Normal appearance. She is obese.  Cardiovascular:     Rate and Rhythm: Normal rate.     Pulses: Normal pulses.  Pulmonary:     Effort: Pulmonary effort is normal.     Breath sounds: Normal breath sounds.  Musculoskeletal: Normal range of motion.  Skin:    General: Skin is warm and dry.  Neurological:     Mental Status: She is alert and oriented to person, place, and time.  Psychiatric:        Behavior: Behavior normal.   RECENT LABS AND TESTS: BMET    Component Value Date/Time   NA 138 02/18/2019 0905   K 4.2 02/18/2019 0905   CL 103 02/18/2019 0905   CO2 20 02/18/2019 0905   GLUCOSE 80 02/18/2019 0905   BUN 9 02/18/2019 0905   CREATININE 0.70 02/18/2019 0905    CALCIUM 9.8 02/18/2019 0905   GFRNONAA 119 02/18/2019 0905   GFRAA 137 02/18/2019 0905   Lab Results  Component Value Date   HGBA1C 5.6 02/18/2019   Lab Results  Component Value Date   INSULIN 10.7 02/18/2019   CBC    Component Value Date/Time   WBC 8.9 02/18/2019 0905   RBC 4.81 02/18/2019 0905   HGB 13.9 02/18/2019 0905   HCT 42.1 02/18/2019 0905   PLT 266 02/18/2019 0905   MCV 88 02/18/2019 0905   MCH 28.9 02/18/2019 0905   MCHC 33.0 02/18/2019 0905   RDW 12.6 02/18/2019 0905   LYMPHSABS 1.8 02/18/2019 0905   EOSABS 0.2 02/18/2019 0905   BASOSABS 0.1 02/18/2019 0905   Iron/TIBC/Ferritin/ %Sat No results found for: IRON, TIBC, FERRITIN, IRONPCTSAT Lipid Panel     Component Value Date/Time   CHOL 237 (H) 02/18/2019 0905   TRIG 208 (H) 02/18/2019 0905   HDL 64 02/18/2019 0905   LDLCALC 136 (H) 02/18/2019 0905   Hepatic Function Panel     Component Value Date/Time   PROT 6.9 02/18/2019 0905   ALBUMIN 4.6 02/18/2019 0905   AST 24 02/18/2019 0905   ALT 23 02/18/2019 0905   ALKPHOS 77 02/18/2019 0905   BILITOT 0.4 02/18/2019 0905      Component Value Date/Time   TSH 3.540 02/18/2019 0905   Results for SHONETTE, RHAMES RIVERS (MRN [id number]) as of 03/05/2019 11:01  Ref. Range 02/18/2019 09:05  Vitamin D, 25-Hydroxy Latest Ref Range: 30.0 - 100.0 ng/mL 26.2 (L)   OBESITY BEHAVIORAL INTERVENTION VISIT  Today's visit was #2  Starting weight: 234 lbs Starting date: 02/18/2019 Today's weight: 223 lbs  Today's date: 03/04/2019 Total lbs lost to date: 11     03/04/2019  Height 5\' 8"  (1.727 m)  Weight 223 lb (101.2 kg)  BMI (Calculated) 33.91  BLOOD PRESSURE - SYSTOLIC 104  BLOOD PRESSURE - DIASTOLIC 70   Body Fat % 37.9 %  Total Body Water (lbs) 90.8 lbs   ASK: We discussed the diagnosis of obesity with Mira today and Anushri agreed to give Irving Burton permission to discuss obesity behavioral modification therapy today.  ASSESS: Bernadene has the  diagnosis of obesity and her BMI today is 34.0. Tiyah is in the action stage of change.   ADVISE: Lorel was educated on the multiple health risks of obesity as well as the benefit of weight loss to improve her  health. She was advised of the need for long term treatment and the importance of lifestyle modifications to improve her current health and to decrease her risk of future health problems.  AGREE: Multiple dietary modification options and treatment options were discussed and  Irving Burtonmily agreed to follow the recommendations documented in the above note.  ARRANGE: Irving Burtonmily was educated on the importance of frequent visits to treat obesity as outlined per CMS and USPSTF guidelines and agreed to schedule her next follow up appointment today.  I, Marianna Paymentenise Haag, am acting as Energy managertranscriptionist for Quillian Quincearen Beasley, MD  I have reviewed the above documentation for accuracy and completeness, and I agree with the above. -Quillian Quincearen Beasley, MD

## 2019-03-18 ENCOUNTER — Other Ambulatory Visit: Payer: Self-pay

## 2019-03-18 ENCOUNTER — Encounter (INDEPENDENT_AMBULATORY_CARE_PROVIDER_SITE_OTHER): Payer: Self-pay | Admitting: Family Medicine

## 2019-03-18 ENCOUNTER — Ambulatory Visit (INDEPENDENT_AMBULATORY_CARE_PROVIDER_SITE_OTHER): Payer: BC Managed Care – PPO | Admitting: Family Medicine

## 2019-03-18 VITALS — BP 109/70 | HR 83 | Temp 97.5°F | Ht 68.0 in | Wt 221.0 lb

## 2019-03-18 DIAGNOSIS — E8881 Metabolic syndrome: Secondary | ICD-10-CM

## 2019-03-18 DIAGNOSIS — E669 Obesity, unspecified: Secondary | ICD-10-CM | POA: Diagnosis not present

## 2019-03-18 DIAGNOSIS — Z9189 Other specified personal risk factors, not elsewhere classified: Secondary | ICD-10-CM | POA: Diagnosis not present

## 2019-03-18 DIAGNOSIS — Z6833 Body mass index (BMI) 33.0-33.9, adult: Secondary | ICD-10-CM

## 2019-03-18 DIAGNOSIS — E559 Vitamin D deficiency, unspecified: Secondary | ICD-10-CM

## 2019-03-18 MED ORDER — METFORMIN HCL 500 MG PO TABS: 500.0000 mg | ORAL_TABLET | Freq: Every day | ORAL | 0 refills | Status: DC

## 2019-03-18 MED ORDER — VITAMIN D (ERGOCALCIFEROL) 1.25 MG (50000 UNIT) PO CAPS: 50000.0000 [IU] | ORAL_CAPSULE | ORAL | 0 refills | Status: DC

## 2019-03-18 NOTE — Progress Notes (Signed)
Office: [phone]  /  Fax: [phone]   HPI:   Chief Complaint: OBESITY Tamara Meza is here to discuss her progress with her obesity treatment plan. She is on the Category 3 plan and is following her eating plan approximately 80% of the time. She states she is walking 45-60 minutes 2-3 times per week. Tamara Meza did well with weight loss even over Thanksgiving. Hunger is controlled, but she noted it has been difficult to get back on track with her eating plan after indulging.  Her weight is 221 lb (100.2 kg) today and has had a weight loss of 2 pounds over a period of 2 weeks since her last visit. She has lost 13 lbs since starting treatment with [place].  Insulin Resistance Tamara Meza has a diagnosis of insulin resistance based on her elevated fasting insulin level >5. Although Capucine's blood glucose readings are still under good control, insulin resistance puts her at greater risk of metabolic syndrome and diabetes. She started metformin and is tolerating it well. She continues to work on diet and exercise to decrease risk of diabetes.  At risk for diabetes Tamara Meza is at higher than average risk for developing diabetes due to her obesity. She currently denies polyuria or polydipsia.  Vitamin D deficiency Tamara Meza has a diagnosis of Vitamin D deficiency. She is currently taking prescription Vit D and denies nausea, vomiting or muscle weakness.  ASSESSMENT AND PLAN:  Insulin resistance - Plan: metFORMIN (GLUCOPHAGE) 500 MG tablet  Vitamin D deficiency - Plan: Vitamin D, Ergocalciferol, (DRISDOL) 1.25 MG (50000 UT) CAPS capsule  At risk for diabetes mellitus  Class 1 obesity with serious comorbidity and body mass index (BMI) of 33.0 to 33.9 in adult, unspecified obesity type  PLAN:  Insulin Resistance Tamara Meza will continue to work on weight loss, exercise, and decreasing simple carbohydrates in her diet to help decrease the risk of diabetes. We dicussed metformin including benefits and risks. She was  informed that eating too many simple carbohydrates or too many calories at one sitting increases the likelihood of GI side effects. Tamara Meza was given a refill on metformin 500 mg #30 with 0 refills. She agrees to follow-up with our clinic in 2 weeks.  Diabetes risk counseling Tamara Meza was given extended (15 minutes) diabetes prevention counseling today. She is 28 y.o. female and has risk factors for diabetes including obesity. We discussed intensive lifestyle modifications today with an emphasis on weight loss as well as increasing exercise and decreasing simple carbohydrates in her diet.  Vitamin D Deficiency Tamara Meza was informed that low Vitamin D levels contributes to fatigue and are associated with obesity, breast, and colon cancer. She agrees to continue to take prescription Vit D @ 50,000 IU every week #4 with 0 refills and will follow-up for routine testing of Vitamin D, at least 2-3 times per year. She was informed of the risk of over-replacement of Vitamin D and agrees to not increase her dose unless she discusses this with [place] first. Tamara Meza agrees to follow-up with our clinic in 2 weeks.  Obesity Tamara Meza is currently in the action stage of change. As such, her goal is to continue with weight loss efforts. She has agreed to follow the Category 3 plan. Tamara Meza has been instructed to work up to a goal of 150 minutes of combined cardio and strengthening exercise per week for weight loss and overall health benefits. We discussed the following Behavioral Modification Strategies today: work on meal planning and easy cooking plans, and holiday eating strategies.  Tamara Meza  has agreed to follow-up with our clinic in 2 weeks. She was informed of the importance of frequent follow-up visits to maximize her success with intensive lifestyle modifications for her multiple health conditions.  ALLERGIES: Allergies  Allergen Reactions  . Codeine     hives  . Sulfa Antibiotics     Hives-? If allergy, patient states  does not remember having this allergy    MEDICATIONS: Current Outpatient Medications on File Prior to Visit  Medication Sig Dispense Refill  . amphetamine-dextroamphetamine (ADDERALL) 10 MG tablet Take 10 mg by mouth 2 (two) times daily.   0  . metFORMIN (GLUCOPHAGE) 500 MG tablet Take 1 tablet (500 mg total) by mouth daily with breakfast. 30 tablet 0  . sertraline (ZOLOFT) 100 MG tablet Take 100 mg by mouth daily.    . valACYclovir (VALTREX) 500 MG tablet Take 1 tablet (500 mg total) by mouth daily. 30 tablet 1  . Vitamin D, Ergocalciferol, (DRISDOL) 1.25 MG (50000 UT) CAPS capsule Take 1 capsule (50,000 Units total) by mouth every 7 (seven) days. 4 capsule 0   No current facility-administered medications on file prior to visit.     PAST MEDICAL HISTORY: Past Medical History:  Diagnosis Date  . ADD (attention deficit disorder)   . Anxiety   . Back pain   . Depression   . Elevated cholesterol   . Headache     PAST SURGICAL HISTORY: Past Surgical History:  Procedure Laterality Date  . ANKLE SURGERY  2008   right  . TONSILLECTOMY    . WISDOM TOOTH EXTRACTION      SOCIAL HISTORY: Social History   Tobacco Use  . Smoking status: Never Smoker  . Smokeless tobacco: Never Used  Substance Use Topics  . Alcohol use: Yes    Alcohol/week: 1.0 - 2.0 standard drinks    Types: 1 - 2 Standard drinks or equivalent per week  . Drug use: No    FAMILY HISTORY: Family History  Problem Relation Age of Onset  . Diabetes Father   . Hypertension Father   . Sleep apnea Father   . Heart disease Mother   . Anxiety disorder Mother   . Depression Mother   . Sleep apnea Mother   . Obesity Mother   . Infertility Paternal Grandmother   . Infertility Paternal Aunt   . Varicose Veins Maternal Grandmother   . Anxiety disorder Other        maternal side  . Anxiety disorder Paternal Aunt    ROS: Review of Systems  Gastrointestinal: Negative for nausea and vomiting.  Musculoskeletal:        Negative for muscle weakness.   PHYSICAL EXAM: Blood pressure 109/70, pulse 83, temperature (!) 97.5 F (36.4 C), temperature source Oral, height 5\' 8"  (1.727 m), weight 221 lb (100.2 kg), last menstrual period 02/24/2019, SpO2 99 %. Body mass index is 33.6 kg/m. Physical Exam Vitals signs reviewed.  Constitutional:      Appearance: Normal appearance. She is obese.  Cardiovascular:     Rate and Rhythm: Normal rate.     Pulses: Normal pulses.  Pulmonary:     Effort: Pulmonary effort is normal.     Breath sounds: Normal breath sounds.  Musculoskeletal: Normal range of motion.  Skin:    General: Skin is warm and dry.  Neurological:     Mental Status: She is alert and oriented to person, place, and time.  Psychiatric:        Behavior: Behavior normal.  RECENT LABS AND TESTS: BMET    Component Value Date/Time   NA 138 02/18/2019 0905   K 4.2 02/18/2019 0905   CL 103 02/18/2019 0905   CO2 20 02/18/2019 0905   GLUCOSE 80 02/18/2019 0905   BUN 9 02/18/2019 0905   CREATININE 0.70 02/18/2019 0905   CALCIUM 9.8 02/18/2019 0905   GFRNONAA 119 02/18/2019 0905   GFRAA 137 02/18/2019 0905   Lab Results  Component Value Date   HGBA1C 5.6 02/18/2019   Lab Results  Component Value Date   INSULIN 10.7 02/18/2019   CBC    Component Value Date/Time   WBC 8.9 02/18/2019 0905   RBC 4.81 02/18/2019 0905   HGB 13.9 02/18/2019 0905   HCT 42.1 02/18/2019 0905   PLT 266 02/18/2019 0905   MCV 88 02/18/2019 0905   MCH 28.9 02/18/2019 0905   MCHC 33.0 02/18/2019 0905   RDW 12.6 02/18/2019 0905   LYMPHSABS 1.8 02/18/2019 0905   EOSABS 0.2 02/18/2019 0905   BASOSABS 0.1 02/18/2019 0905   Iron/TIBC/Ferritin/ %Sat No results found for: IRON, TIBC, FERRITIN, IRONPCTSAT Lipid Panel     Component Value Date/Time   CHOL 237 (H) 02/18/2019 0905   TRIG 208 (H) 02/18/2019 0905   HDL 64 02/18/2019 0905   LDLCALC 136 (H) 02/18/2019 0905   Hepatic Function Panel     Component  Value Date/Time   PROT 6.9 02/18/2019 0905   ALBUMIN 4.6 02/18/2019 0905   AST 24 02/18/2019 0905   ALT 23 02/18/2019 0905   ALKPHOS 77 02/18/2019 0905   BILITOT 0.4 02/18/2019 0905      Component Value Date/Time   TSH 3.540 02/18/2019 0905   Results for Tamara, HUSSEIN Meza (MRN [id number]) as of 03/18/2019 15:55  Ref. Range 02/18/2019 09:05  Vitamin D, 25-Hydroxy Latest Ref Range: 30.0 - 100.0 ng/mL 26.2 (L)   OBESITY BEHAVIORAL INTERVENTION VISIT  Today's visit was #3  Starting weight: 234 lbs Starting date: 02/18/2019 Today's weight: 221 lbs  Today's date: 03/18/2019 Total lbs lost to date: 13     03/18/2019  Height 5\' 8"  (1.727 m)  Weight 221 lb (100.2 kg)  BMI (Calculated) 33.61  BLOOD PRESSURE - SYSTOLIC 588  BLOOD PRESSURE - DIASTOLIC 70   Body Fat % 50.2 %  Total Body Water (lbs) 91.6 lbs   ASK: We discussed the diagnosis of obesity with [place] today and Jenesys agreed to give [place] permission to discuss obesity behavioral modification therapy today.  ASSESS: Shyteria has the diagnosis of obesity and her BMI today is 33.6. Tamara Meza is in the action stage of change.   ADVISE: Angelyne was educated on the multiple health risks of obesity as well as the benefit of weight loss to improve her health. She was advised of the need for long term treatment and the importance of lifestyle modifications to improve her current health and to decrease her risk of future health problems.  AGREE: Multiple dietary modification options and treatment options were discussed and  Nazariah agreed to follow the recommendations documented in the above note.  ARRANGE: Aurilla was educated on the importance of frequent visits to treat obesity as outlined per CMS and USPSTF guidelines and agreed to schedule her next follow up appointment today.  I, Michaelene Song, am acting as Location manager for Dennard Nip, MD  I have reviewed the above documentation for accuracy and completeness, and I  agree with the above. -Dennard Nip, MD

## 2019-04-01 ENCOUNTER — Ambulatory Visit (INDEPENDENT_AMBULATORY_CARE_PROVIDER_SITE_OTHER): Payer: BC Managed Care – PPO | Admitting: Family Medicine

## 2019-04-01 ENCOUNTER — Other Ambulatory Visit: Payer: Self-pay

## 2019-04-01 ENCOUNTER — Encounter (INDEPENDENT_AMBULATORY_CARE_PROVIDER_SITE_OTHER): Payer: Self-pay | Admitting: Family Medicine

## 2019-04-01 VITALS — BP 109/73 | HR 80 | Temp 97.5°F | Ht 68.0 in | Wt 218.0 lb

## 2019-04-01 DIAGNOSIS — E669 Obesity, unspecified: Secondary | ICD-10-CM

## 2019-04-01 DIAGNOSIS — E559 Vitamin D deficiency, unspecified: Secondary | ICD-10-CM | POA: Diagnosis not present

## 2019-04-01 DIAGNOSIS — E538 Deficiency of other specified B group vitamins: Secondary | ICD-10-CM

## 2019-04-01 DIAGNOSIS — Z9189 Other specified personal risk factors, not elsewhere classified: Secondary | ICD-10-CM

## 2019-04-01 DIAGNOSIS — E782 Mixed hyperlipidemia: Secondary | ICD-10-CM | POA: Diagnosis not present

## 2019-04-01 DIAGNOSIS — E8881 Metabolic syndrome: Secondary | ICD-10-CM

## 2019-04-01 DIAGNOSIS — Z6833 Body mass index (BMI) 33.0-33.9, adult: Secondary | ICD-10-CM

## 2019-04-01 DIAGNOSIS — F418 Other specified anxiety disorders: Secondary | ICD-10-CM

## 2019-04-01 DIAGNOSIS — F908 Attention-deficit hyperactivity disorder, other type: Secondary | ICD-10-CM

## 2019-04-03 NOTE — Progress Notes (Signed)
Office: [phone]  /  Fax: [phone]   HPI:  Chief Complaint: OBESITY Tamara Meza is here to discuss her progress with her obesity treatment plan. She is on the Category 3 plan and states she is following her eating plan approximately 50% of the time. She states she is biking 90 minutes 2 times per week, walking 60 minutes 2 times per week, and doing HIIT workouts 60 minutes 2 times per week.  Tamara Meza states she and her mom are following the diet plan. She would like more options for protein and for lunch.  Today's visit was #4 Starting weight: 234 lbs Starting date: 02/18/2019 Today's weight: 218 lbs  Today's date: 04/01/2019 Total lbs lost to date: 16 Total lbs lost since last in-office visit: 3  Insulin Resistance Tamara Meza has a diagnosis of insulin resistance and is taking metformin 500 mg PO daily. Denies polyphagia. Last insulin level was 10.7 on 02/18/2019.  At risk for diabetes Tamara Meza is at higher than average risk for developing diabetes due to her obesity.   Vitamin D deficiency Tamara Meza has a diagnosis of Vitamin D deficiency and is taking prescription Vitamin D. Last Vitamin D level was 26.2 on 02/18/2019.  Hyperlipidemia Tamara Meza has hyperlipidemia with LDL 136 and triglycerides [id number] on 02/18/2019.  Vitamin B12 deficiency Tamara Meza has Vitamin B12 deficiency with Vitamin B12 level 307 on 02/18/2019. Goal is for level to be above 400.  ADHD Tamara Meza has ADHD and is taking Adderall.  Situational Anxiety Tamara Meza has situational anxiety and is taking Zoloft.  ASSESSMENT AND PLAN:  Insulin resistance - Plan: metFORMIN (GLUCOPHAGE) 500 MG tablet  Vitamin D deficiency - Plan: Vitamin D, Ergocalciferol, (DRISDOL) 1.25 MG (50000 UT) CAPS capsule  Mixed hyperlipidemia  B12 deficiency  Attention deficit hyperactivity disorder (ADHD), other type  Situational anxiety  At risk for diabetes mellitus  Class 1 obesity with serious comorbidity and body mass index (BMI) of 33.0 to 33.9  in adult, unspecified obesity type  PLAN:  Insulin Resistance Tamara Meza will continue to work on weight loss, exercise, and decreasing simple carbohydrates to help decrease the risk of diabetes. Tamara Meza was given a prescription for metformin 500 mg PO daily #30 with 0 refills and agreed to follow-up with us as directed to closely monitor her progress.  Diabetes risk counseling (~15 min) Tamara Meza is a 28 y.o. female and has risk factors for diabetes including obesity. We discussed intensive lifestyle modifications today with an emphasis on weight loss as well as increasing exercise and decreasing simple carbohydrates in her diet.  Vitamin D Deficiency Tamara Meza was informed that low Vitamin D levels contributes to fatigue and are associated with obesity, breast, and colon cancer. She agrees to continue to take prescription Vit D @ 50,000 IU every week #4 with 0 refills and will follow-up for routine testing of Vitamin D, at least 2-3 times per year. She was informed of the risk of over-replacement of Vitamin D and agrees to not increase her dose unless she discusses this with us first. Tamara Meza agrees to follow-up with our clinic in 3 weeks.  Hyperlipidemia Intensive lifestyle modifications as the first line treatment for hyperlipidemia. We discussed many lifestyle modifications today and Tamara Meza will continue to work on diet, exercise and weight loss efforts. We will monitor.  Vitamin B12 deficiency We will monitor.  ADHD We will monitor.  Situational Anxiety We will monitor.  Obesity Tamara Meza is currently in the action stage of change. As such, her goal is to continue with weight loss efforts. She  has agreed to follow the Category 3 plan. Tamara Meza has been instructed to work up to a goal of 150 minutes of combined cardio and strengthening exercise per week for weight loss and overall health benefits. We discussed the following Behavioral Modification Strategies today: increasing lean protein intake,  decreasing simple carbohydrates, work on meal planning and easy cooking plans, dealing with family or coworker sabotage, and holiday eating strategies.   [place] has agreed to follow-up with our clinic in 3 weeks. She was informed of the importance of frequent follow-up visits to maximize her success with intensive lifestyle modifications for her multiple health conditions.  ALLERGIES: Allergies  Allergen Reactions  . Codeine     hives  . Sulfa Antibiotics     Hives-? If allergy, patient states does not remember having this allergy    MEDICATIONS: Current Outpatient Medications on File Prior to Visit  Medication Sig Dispense Refill  . amphetamine-dextroamphetamine (ADDERALL) 10 MG tablet Take 10 mg by mouth 2 (two) times daily.   0  . metFORMIN (GLUCOPHAGE) 500 MG tablet Take 1 tablet (500 mg total) by mouth daily with breakfast. 30 tablet 0  . sertraline (ZOLOFT) 100 MG tablet Take 100 mg by mouth daily.    . valACYclovir (VALTREX) 500 MG tablet Take 1 tablet (500 mg total) by mouth daily. 30 tablet 1  . Vitamin D, Ergocalciferol, (DRISDOL) 1.25 MG (50000 UT) CAPS capsule Take 1 capsule (50,000 Units total) by mouth every 7 (seven) days. 4 capsule 0   No current facility-administered medications on file prior to visit.    PAST MEDICAL HISTORY: Past Medical History:  Diagnosis Date  . ADD (attention deficit disorder)   . Anxiety   . Back pain   . Depression   . Elevated cholesterol   . Headache     PAST SURGICAL HISTORY: Past Surgical History:  Procedure Laterality Date  . ANKLE SURGERY  2008   right  . TONSILLECTOMY    . WISDOM TOOTH EXTRACTION      SOCIAL HISTORY: Social History   Tobacco Use  . Smoking status: Never Smoker  . Smokeless tobacco: Never Used  Substance Use Topics  . Alcohol use: Yes    Alcohol/week: 1.0 - 2.0 standard drinks    Types: 1 - 2 Standard drinks or equivalent per week  . Drug use: No    FAMILY HISTORY: Family History  Problem  Relation Age of Onset  . Diabetes Father   . Hypertension Father   . Sleep apnea Father   . Heart disease Mother   . Anxiety disorder Mother   . Depression Mother   . Sleep apnea Mother   . Obesity Mother   . Infertility Paternal Grandmother   . Infertility Paternal Aunt   . Varicose Veins Maternal Grandmother   . Anxiety disorder Other        maternal side  . Anxiety disorder Paternal Aunt    ROS: Review of Systems  Constitutional: Positive for weight loss.  Psychiatric/Behavioral: The patient is nervous/anxious (situational anxiety).    PHYSICAL EXAM: Blood pressure 109/73, pulse 80, temperature (!) 97.5 F (36.4 C), temperature source Oral, height 5\' 8"  (1.727 m), weight 218 lb (98.9 kg), last menstrual period 02/24/2019, SpO2 98 %. Body mass index is 33.15 kg/m.   General: Cooperative, alert, well developed, in no acute distress. HEENT: Conjunctivae and lids unremarkable. Neck: No thyromegaly.  Cardiovascular: Regular rhythm.  Lungs: Normal work of breathing. Extremities: No edema.  Neurologic: No focal deficits.  RECENT LABS AND TESTS: BMET    Component Value Date/Time   NA 138 02/18/2019 0905   K 4.2 02/18/2019 0905   CL 103 02/18/2019 0905   CO2 20 02/18/2019 0905   GLUCOSE 80 02/18/2019 0905   BUN 9 02/18/2019 0905   CREATININE 0.70 02/18/2019 0905   CALCIUM 9.8 02/18/2019 0905   GFRNONAA 119 02/18/2019 0905   GFRAA 137 02/18/2019 0905   Lab Results  Component Value Date   HGBA1C 5.6 02/18/2019   Lab Results  Component Value Date   INSULIN 10.7 02/18/2019   CBC    Component Value Date/Time   WBC 8.9 02/18/2019 0905   RBC 4.81 02/18/2019 0905   HGB 13.9 02/18/2019 0905   HCT 42.1 02/18/2019 0905   PLT 266 02/18/2019 0905   MCV 88 02/18/2019 0905   MCH 28.9 02/18/2019 0905   MCHC 33.0 02/18/2019 0905   RDW 12.6 02/18/2019 0905   LYMPHSABS 1.8 02/18/2019 0905   EOSABS 0.2 02/18/2019 0905   BASOSABS 0.1 02/18/2019 0905    Iron/TIBC/Ferritin/ %Sat No results found for: IRON, TIBC, FERRITIN, IRONPCTSAT Lipid Panel     Component Value Date/Time   CHOL 237 (H) 02/18/2019 0905   TRIG 208 (H) 02/18/2019 0905   HDL 64 02/18/2019 0905   LDLCALC 136 (H) 02/18/2019 0905   Hepatic Function Panel     Component Value Date/Time   PROT 6.9 02/18/2019 0905   ALBUMIN 4.6 02/18/2019 0905   AST 24 02/18/2019 0905   ALT 23 02/18/2019 0905   ALKPHOS 77 02/18/2019 0905   BILITOT 0.4 02/18/2019 0905      Component Value Date/Time   TSH 3.540 02/18/2019 0905    I, Marianna Payment, am acting as Energy manager for [employer]. Earlene Plater, DO  I have reviewed the above documentation for accuracy and completeness, and I agree with the above. Helane Rima, DO

## 2019-04-04 MED ORDER — METFORMIN HCL 500 MG PO TABS: 500.0000 mg | ORAL_TABLET | Freq: Every day | ORAL | 0 refills | Status: DC

## 2019-04-04 MED ORDER — VITAMIN D (ERGOCALCIFEROL) 1.25 MG (50000 UNIT) PO CAPS: 50000.0000 [IU] | ORAL_CAPSULE | ORAL | 0 refills | Status: DC

## 2019-04-07 ENCOUNTER — Encounter (INDEPENDENT_AMBULATORY_CARE_PROVIDER_SITE_OTHER): Payer: Self-pay | Admitting: Family Medicine

## 2019-04-21 DIAGNOSIS — A6000 Herpesviral infection of urogenital system, unspecified: Secondary | ICD-10-CM | POA: Diagnosis not present

## 2019-04-21 DIAGNOSIS — F411 Generalized anxiety disorder: Secondary | ICD-10-CM | POA: Diagnosis not present

## 2019-04-21 DIAGNOSIS — F900 Attention-deficit hyperactivity disorder, predominantly inattentive type: Secondary | ICD-10-CM | POA: Diagnosis not present

## 2019-04-23 ENCOUNTER — Other Ambulatory Visit: Payer: Self-pay

## 2019-04-23 ENCOUNTER — Ambulatory Visit (INDEPENDENT_AMBULATORY_CARE_PROVIDER_SITE_OTHER): Payer: BC Managed Care – PPO | Admitting: Family Medicine

## 2019-04-23 ENCOUNTER — Encounter (INDEPENDENT_AMBULATORY_CARE_PROVIDER_SITE_OTHER): Payer: Self-pay | Admitting: Family Medicine

## 2019-04-23 VITALS — BP 114/66 | HR 90 | Temp 97.7°F | Ht 68.0 in | Wt 220.0 lb

## 2019-04-23 DIAGNOSIS — E669 Obesity, unspecified: Secondary | ICD-10-CM

## 2019-04-23 DIAGNOSIS — E559 Vitamin D deficiency, unspecified: Secondary | ICD-10-CM

## 2019-04-23 DIAGNOSIS — E8881 Metabolic syndrome: Secondary | ICD-10-CM

## 2019-04-23 DIAGNOSIS — Z9189 Other specified personal risk factors, not elsewhere classified: Secondary | ICD-10-CM

## 2019-04-23 DIAGNOSIS — M9903 Segmental and somatic dysfunction of lumbar region: Secondary | ICD-10-CM | POA: Diagnosis not present

## 2019-04-23 DIAGNOSIS — F418 Other specified anxiety disorders: Secondary | ICD-10-CM

## 2019-04-23 DIAGNOSIS — E538 Deficiency of other specified B group vitamins: Secondary | ICD-10-CM

## 2019-04-23 DIAGNOSIS — E7849 Other hyperlipidemia: Secondary | ICD-10-CM | POA: Diagnosis not present

## 2019-04-23 DIAGNOSIS — M9904 Segmental and somatic dysfunction of sacral region: Secondary | ICD-10-CM | POA: Diagnosis not present

## 2019-04-23 DIAGNOSIS — Z6833 Body mass index (BMI) 33.0-33.9, adult: Secondary | ICD-10-CM

## 2019-04-23 DIAGNOSIS — M9905 Segmental and somatic dysfunction of pelvic region: Secondary | ICD-10-CM | POA: Diagnosis not present

## 2019-04-23 DIAGNOSIS — M9902 Segmental and somatic dysfunction of thoracic region: Secondary | ICD-10-CM | POA: Diagnosis not present

## 2019-04-23 MED ORDER — VITAMIN D (ERGOCALCIFEROL) 1.25 MG (50000 UNIT) PO CAPS: 50000.0000 [IU] | ORAL_CAPSULE | ORAL | 0 refills | Status: DC

## 2019-04-24 NOTE — Progress Notes (Signed)
Chief Complaint: OBESITY  Tamara Meza is here to discuss her progress with her obesity treatment plan along with follow-up of her obesity related diagnoses. Tamara Meza is on the Category 3 Plan and states she is following her eating plan approximately 25% of the time. Tamara Meza states she is walking for 45-60 minutes 3-4 times per week.  Today's visit was #: 5 Starting weight: 234 lbs Starting date: 02/18/2019 Today's weight: 220 lbs Today's date: 04/24/2019 Total lbs lost to date: 14 lbs Total lbs lost since last in-office visit: 0  Interim History: Tamara Meza was off her diet over the holidays but did not eat excessively.  She continued to exercise, though.  She says she will be moving to [place] in 2.5 weeks.  She still plans to continue with with [facility].  Subjective:   1. Insulin resistance Last insulin lab ws 10.7 on 02/18/2019.  Tamara Meza has a diagnosis of insulin resistance based on her elevated fasting insulin level >5. Although Tamara Meza's blood glucose readings are still under good control, insulin resistance puts her at greater risk of metabolic syndrome and diabetes. She is taking metformin currently and continues to work on diet and exercise to decrease risk of diabetes.  2. Vitamin D deficiency Tamara Meza has a diagnosis of vitamin D deficiency. She is currently taking vit D. She denies nausea, vomiting or muscle weakness.  Last vitamin D level was 26.2 on 02/18/2019.  3. Other hyperlipidemia Tamara Meza's LDL was 136 and triglycerides 208 on 02/18/2019.  Tamara Meza has hyperlipidemia and has been trying to improve her cholesterol levels with intensive lifestyle modification including a low saturated fat diet, exercise and weight loss. She denies any chest pain, claudication or myalgias.  4. B12 deficiency Her vitamin B12 level was 307 on 02/18/2019.  5. Situational anxiety Tamara Meza has been taking Zoloft for her situational anxiety.  6. At risk for heart disease Tamara Meza is at a  higher than average risk for cardiovascular disease due to obesity. Reviewed: taking medications as instructed and no medication side effects noted.  Assessment/Plan:   1. Insulin resistance Tamara Meza will continue to work on weight loss, exercise, and decreasing simple carbohydrates to help decrease the risk of diabetes. Tamara Meza agreed to follow-up with [place] as directed to closely monitor her progress.  She will continue metformin 500 mg daily.  2. Vitamin D deficiency Low Vitamin D level contributes to fatigue and are associated with obesity, breast, and colon cancer. She agrees to continue to take prescription Vitamin D @50 ,000 IU every week and will follow-up for routine testing of vitamin D, at least 2-3 times per year to avoid over-replacement.   - Vitamin D, Ergocalciferol, (DRISDOL) 1.25 MG (50000 UT) CAPS capsule; Take 1 capsule (50,000 Units total) by mouth every 7 (seven) days.  Dispense: 4 capsule; Refill: 0  3. Other hyperlipidemia Cardiovascular risk and specific lipid/LDL goals reviewed.  We discussed several lifestyle modifications today and Tamara Meza will continue to work on diet, exercise and weight loss efforts. Orders and follow up as documented in patient record.   4. B12 deficiency The diagnosis was reviewed with the patient. Counseling provided today, see below. We will continue to monitor. Orders and follow up as documented in patient record.  Counseling . The body needs vitamin B12: to make red blood cells; to make DNA; and to help the nerves work properly so they can carry messages from the brain to the body.  . The main causes of vitamin B12 deficiency include dietary deficiency,  digestive diseases, pernicious anemia, and having a surgery in which part of the stomach or small intestine is removed.  . Certain medicines can make it harder for the body to absorb vitamin B12. These medicines include: heartburn medications; some antibiotics; some medications used to treat diabetes,  gout, and high cholesterol.  . In some cases, there are no symptoms of this condition. If the condition leads to anemia or nerve damage, various symptoms can occur, such as weakness or fatigue, shortness of breath, and numbness or tingling in your hands and feet.   . Treatment:  o May include taking vitamin B12 supplements.  o Avoid alcohol.  o Eat lots of healthy foods that contain vitamin B12: - Beef, pork, chicken, [place], and organ meats, such as liver.  - Seafood: This includes clams, rainbow trout, salmon, tuna, and haddock. Eggs.  - Cereal and dairy products that are fortified: This means that vitamin B12 has been added to the food.   5. Situational anxiety Behavior modification techniques were discussed today to help Tamara Meza deal with her emotional/non-hunger eating behaviors. Tamara Meza should continue her Zoloft.  Will monitor.  Orders and follow up as documented in patient record.   6. At risk for heart disease Tamara Meza was given (~15 minutes) coronary artery disease prevention counseling today. She is 29 y.o. female and has risk factors for heart disease including obesity. We discussed intensive lifestyle modifications today with an emphasis on specific weight loss instructions and strategies.   7. Class 1 obesity with serious comorbidity and body mass index (BMI) of 33.0 to 33.9 in adult, unspecified obesity type Tamara Meza is currently in the action stage of change. As such, her goal is to continue with weight loss efforts. She has agreed to Category 3 Plan.   We discussed the following exercise goals today: For substantial health benefits, adults should do at least 150 minutes (2 hours and 30 minutes) a week of moderate-intensity, or 75 minutes (1 hour and 15 minutes) a week of vigorous-intensity aerobic physical activity, or an equivalent combination of moderate- and vigorous-intensity aerobic activity. Aerobic activity should be performed in episodes of at least 10 minutes, and preferably, it  should be spread throughout the week. Adults should also include muscle-strengthening activities that involve all major muscle groups on 2 or more days a week.  We discussed the following behavioral modification strategies today: travel eating strategies and holiday eating strategies .  [facility] has agreed to follow-up with our clinic in 2 weeks. She was informed of the importance of frequent follow-up visits to maximize her success with intensive lifestyle modifications for her multiple health conditions.  Objective:   Blood pressure 114/66, pulse 90, temperature 97.7 F (36.5 C), temperature source Oral, height 5\' 8"  (1.727 m), weight 220 lb (99.8 kg), SpO2 97 %. Body mass index is 33.45 kg/m.  General: Cooperative, alert, well developed, in no acute distress. HEENT: Conjunctivae and lids unremarkable. Neck: No thyromegaly.  Cardiovascular: Regular rhythm.  Lungs: Normal work of breathing. Extremities: No edema.  Neurologic: No focal deficits.   Lab Results  Component Value Date   CREATININE 0.70 02/18/2019   BUN 9 02/18/2019   NA 138 02/18/2019   K 4.2 02/18/2019   CL 103 02/18/2019   CO2 20 02/18/2019   Lab Results  Component Value Date   ALT 23 02/18/2019   AST 24 02/18/2019   ALKPHOS 77 02/18/2019   BILITOT 0.4 02/18/2019   Lab Results  Component Value Date   HGBA1C  5.6 02/18/2019   Lab Results  Component Value Date   INSULIN 10.7 02/18/2019   Lab Results  Component Value Date   TSH 3.540 02/18/2019   Lab Results  Component Value Date   CHOL 237 (H) 02/18/2019   HDL 64 02/18/2019   LDLCALC 136 (H) 02/18/2019   TRIG 208 (H) 02/18/2019   Lab Results  Component Value Date   WBC 8.9 02/18/2019   HGB 13.9 02/18/2019   HCT 42.1 02/18/2019   MCV 88 02/18/2019   PLT 266 02/18/2019   No results found for: IRON, TIBC, FERRITIN  Attestation Statements:   Reviewed by clinician on day of visit: allergies, medications, problem list, medical  history, surgical history, family history, social history and previous encounter notes.  This visit occurred during the SARS-CoV-2 public health emergency. Safety protocols were in place, including screening questions prior to the visit, additional usage of staff PPE, and extensive cleaning of exam room while observing appropriate contact time as indicated for disinfecting solutions. (CPT [id number])  I, Water quality scientist, CMA, am acting as transcriptionist for [employer], DO.  I have reviewed the above documentation for accuracy and completeness, and I agree with the above. Briscoe Deutscher, DO

## 2019-04-26 ENCOUNTER — Encounter (INDEPENDENT_AMBULATORY_CARE_PROVIDER_SITE_OTHER): Payer: Self-pay | Admitting: Family Medicine

## 2019-05-07 ENCOUNTER — Encounter (INDEPENDENT_AMBULATORY_CARE_PROVIDER_SITE_OTHER): Payer: Self-pay | Admitting: Family Medicine

## 2019-05-07 ENCOUNTER — Ambulatory Visit (INDEPENDENT_AMBULATORY_CARE_PROVIDER_SITE_OTHER): Payer: BC Managed Care – PPO | Admitting: Family Medicine

## 2019-05-07 ENCOUNTER — Other Ambulatory Visit: Payer: Self-pay

## 2019-05-07 VITALS — BP 111/74 | HR 80 | Temp 97.7°F | Ht 68.0 in | Wt 220.0 lb

## 2019-05-07 DIAGNOSIS — E669 Obesity, unspecified: Secondary | ICD-10-CM | POA: Diagnosis not present

## 2019-05-07 DIAGNOSIS — E8881 Metabolic syndrome: Secondary | ICD-10-CM | POA: Diagnosis not present

## 2019-05-07 DIAGNOSIS — Z6833 Body mass index (BMI) 33.0-33.9, adult: Secondary | ICD-10-CM | POA: Diagnosis not present

## 2019-05-07 DIAGNOSIS — E559 Vitamin D deficiency, unspecified: Secondary | ICD-10-CM

## 2019-05-07 MED ORDER — METFORMIN HCL 500 MG PO TABS: 500.0000 mg | ORAL_TABLET | Freq: Every day | ORAL | 0 refills | Status: DC

## 2019-05-07 MED ORDER — VITAMIN D (ERGOCALCIFEROL) 1.25 MG (50000 UNIT) PO CAPS: 50000.0000 [IU] | ORAL_CAPSULE | ORAL | 0 refills | Status: DC

## 2019-05-08 NOTE — Progress Notes (Signed)
Chief Complaint:   OBESITY Tamara Meza is here to discuss her progress with her obesity treatment plan along with follow-up of her obesity related diagnoses. Tamara Meza is on the Category 3 Plan and states she is following her eating plan approximately 40% of the time. Tamara Meza states she is walking, biking, and on the elliptical for 45-60 minutes 4 times per week.  Today's visit was #: 6 Starting weight: 234 lbs Starting date: 02/18/2019 Today's weight: 220 lbs Today's date: 05/07/2019 Total lbs lost to date: 14 Total lbs lost since last in-office visit: 0  Interim History: Tamara Meza did well maintaining her weight over the holidays. Her hunger is mostly controlled, and she was mindful of her choices but noted increased temptations. She is moving to [place] this weekend, but she still plans to follow up with [place].  Subjective:   1. Insulin resistance Tamara Meza is tolerating metformin well and continues to be mindful of her food choices even with the extra stress of moving.  2. Vitamin D deficiency Tamara Meza's Vit D level is not yet at goal. She is tolerating Vit D prescription well. She requests a refill today.  Assessment/Plan:   1. Insulin resistance Tamara Meza will continue diet and activity, and decreasing simple carbohydrates to help decrease the risk of diabetes. We will refill metformin for 1 month. We will recheck labs in 1 month. Tamara Meza agreed to follow-up with [place] as directed to closely monitor her progress.  - metFORMIN (GLUCOPHAGE) 500 MG tablet; Take 1 tablet (500 mg total) by mouth daily with breakfast.  Dispense: 30 tablet; Refill: 0  2. Vitamin D deficiency Low Vitamin D level contributes to fatigue and are associated with obesity, breast, and colon cancer. We will refill prescription Vit D for 1 month. We will recheck labs in 1 month. Tamara Meza will follow-up for routine testing of Vitamin D, at least 2-3 times per year to avoid over-replacement. We will continue to monitor.  - Vitamin D,  Ergocalciferol, (DRISDOL) 1.25 MG (50000 UNIT) CAPS capsule; Take 1 capsule (50,000 Units total) by mouth every 7 (seven) days.  Dispense: 4 capsule; Refill: 0  3. Class 1 obesity with serious comorbidity and body mass index (BMI) of 33.0 to 33.9 in adult, unspecified obesity type Tamara Meza is currently in the action stage of change. As such, her goal is to continue with weight loss efforts. She has agreed to the Category 3 Plan or practicing portion control and making smarter food choices, such as increasing vegetables and decreasing simple carbohydrates.   Exercise goals: Tamara Meza is to continue her current exercise regimen as is.  Behavioral modification strategies: avoiding temptations and planning for success.  Tamara Meza has agreed to follow-up with our clinic in 3 to 4 weeks. She was informed of the importance of frequent follow-up visits to maximize her success with intensive lifestyle modifications for her multiple health conditions.   Objective:   Blood pressure 111/74, pulse 80, temperature 97.7 F (36.5 C), temperature source Oral, height 5\' 8"  (1.727 m), weight 220 lb (99.8 kg), last menstrual period 03/02/2019, SpO2 97 %. Body mass index is 33.45 kg/m.  General: Cooperative, alert, well developed, in no acute distress. HEENT: Conjunctivae and lids unremarkable. Cardiovascular: Regular rhythm.  Lungs: Normal work of breathing. Neurologic: No focal deficits.   Lab Results  Component Value Date   CREATININE 0.70 02/18/2019   BUN 9 02/18/2019   NA 138 02/18/2019   K 4.2 02/18/2019   CL 103 02/18/2019   CO2 20 02/18/2019  Lab Results  Component Value Date   ALT 23 02/18/2019   AST 24 02/18/2019   ALKPHOS 77 02/18/2019   BILITOT 0.4 02/18/2019   Lab Results  Component Value Date   HGBA1C 5.6 02/18/2019   Lab Results  Component Value Date   INSULIN 10.7 02/18/2019   Lab Results  Component Value Date   TSH 3.540 02/18/2019   Lab Results  Component Value Date   CHOL  237 (H) 02/18/2019   HDL 64 02/18/2019   LDLCALC 136 (H) 02/18/2019   TRIG 208 (H) 02/18/2019   Lab Results  Component Value Date   WBC 8.9 02/18/2019   HGB 13.9 02/18/2019   HCT 42.1 02/18/2019   MCV 88 02/18/2019   PLT 266 02/18/2019   No results found for: IRON, TIBC, FERRITIN  Attestation Statements:   Reviewed by clinician on day of visit: allergies, medications, problem list, medical history, surgical history, family history, social history, and previous encounter notes.   I, Burt Knack, am acting as transcriptionist for Quillian Quince, MD.  I have reviewed the above documentation for accuracy and completeness, and I agree with the above. -  Quillian Quince, MD

## 2019-06-02 ENCOUNTER — Encounter (INDEPENDENT_AMBULATORY_CARE_PROVIDER_SITE_OTHER): Payer: Self-pay | Admitting: Family Medicine

## 2019-06-02 ENCOUNTER — Ambulatory Visit (INDEPENDENT_AMBULATORY_CARE_PROVIDER_SITE_OTHER): Payer: BC Managed Care – PPO | Admitting: Family Medicine

## 2019-06-02 ENCOUNTER — Other Ambulatory Visit: Payer: Self-pay

## 2019-06-02 VITALS — BP 106/73 | HR 66 | Temp 97.5°F | Ht 68.0 in | Wt 218.0 lb

## 2019-06-02 DIAGNOSIS — Z6833 Body mass index (BMI) 33.0-33.9, adult: Secondary | ICD-10-CM

## 2019-06-02 DIAGNOSIS — F411 Generalized anxiety disorder: Secondary | ICD-10-CM

## 2019-06-02 DIAGNOSIS — E88819 Insulin resistance, unspecified: Secondary | ICD-10-CM

## 2019-06-02 DIAGNOSIS — E782 Mixed hyperlipidemia: Secondary | ICD-10-CM | POA: Diagnosis not present

## 2019-06-02 DIAGNOSIS — Z9189 Other specified personal risk factors, not elsewhere classified: Secondary | ICD-10-CM | POA: Diagnosis not present

## 2019-06-02 DIAGNOSIS — E669 Obesity, unspecified: Secondary | ICD-10-CM

## 2019-06-02 DIAGNOSIS — E8881 Metabolic syndrome: Secondary | ICD-10-CM

## 2019-06-02 DIAGNOSIS — E559 Vitamin D deficiency, unspecified: Secondary | ICD-10-CM | POA: Insufficient documentation

## 2019-06-02 MED ORDER — VITAMIN D (ERGOCALCIFEROL) 1.25 MG (50000 UNIT) PO CAPS: 50000.0000 [IU] | ORAL_CAPSULE | ORAL | 0 refills | Status: DC

## 2019-06-02 MED ORDER — METFORMIN HCL 500 MG PO TABS: 500.0000 mg | ORAL_TABLET | Freq: Every day | ORAL | 0 refills | Status: DC

## 2019-06-03 ENCOUNTER — Encounter (INDEPENDENT_AMBULATORY_CARE_PROVIDER_SITE_OTHER): Payer: Self-pay | Admitting: Family Medicine

## 2019-06-03 NOTE — Progress Notes (Signed)
Chief Complaint:   OBESITY Tamara Meza is here to discuss her progress with her obesity treatment plan along with follow-up of her obesity related diagnoses. Tamara Meza is on the Category 3 Plan and states she is following her eating plan approximately 70% of the time. Tamara Meza states she is doing HIT, treadmill, and bike for 50 minutes 5 times per week.  Today's visit was #: 7 Starting weight: 234 lbs Starting date: 02/18/2019 Today's weight: 218 lbs Today's date: 06/02/2019 Total lbs lost to date: 16 lbs Total lbs lost since last in-office visit: 2 lbs  Interim History: Tamara Meza says she has been eating more at night.  Subjective:   1. Insulin resistance Tamara Meza has a diagnosis of insulin resistance based on her elevated fasting insulin level >5. She continues to work on diet and exercise to decrease her risk of diabetes.  Tamara Meza is taking metformin 500 mg daily.  Lab Results  Component Value Date   INSULIN 10.7 02/18/2019   Lab Results  Component Value Date   HGBA1C 5.6 02/18/2019   2. Vitamin D deficiency Tamara Meza's Vitamin D level was 26.2 on 02/18/2019. She is currently taking vit D. She denies nausea, vomiting or muscle weakness.  3. Mixed hyperlipidemia Tamara Meza has hyperlipidemia and has been trying to improve her cholesterol levels with intensive lifestyle modification including a low saturated fat diet, exercise and weight loss. She denies any chest pain, claudication or myalgias.  Lab Results  Component Value Date   ALT 23 02/18/2019   AST 24 02/18/2019   ALKPHOS 77 02/18/2019   BILITOT 0.4 02/18/2019   Lab Results  Component Value Date   CHOL 237 (H) 02/18/2019   HDL 64 02/18/2019   LDLCALC 136 (H) 02/18/2019   TRIG 208 (H) 02/18/2019   4. GAD (generalized anxiety disorder) Tamara Meza is taking Zoloft 100 mg daily for anxiety.  5. At risk for heart disease Tamara Meza is at a higher than average risk for cardiovascular disease due to obesity. Reviewed: no chest pain on exertion, no  dyspnea on exertion, and no swelling of ankles.  Assessment/Plan:   1. Insulin resistance Tamara Meza will continue to work on weight loss, exercise, and decreasing simple carbohydrates to help decrease the risk of diabetes. Tamara Meza agreed to follow-up with [place] as directed to closely monitor her progress.  Orders - metFORMIN (GLUCOPHAGE) 500 MG tablet; Take 1 tablet (500 mg total) by mouth daily in the afternoon.  Dispense: 30 tablet; Refill: 0  2. Vitamin D deficiency Low Vitamin D level contributes to fatigue and are associated with obesity, breast, and colon cancer. She agrees to continue to take prescription Vitamin D @50 ,000 IU every week and will follow-up for routine testing of Vitamin D, at least 2-3 times per year to avoid over-replacement.  Orders - Vitamin D, Ergocalciferol, (DRISDOL) 1.25 MG (50000 UNIT) CAPS capsule; Take 1 capsule (50,000 Units total) by mouth every 7 (seven) days.  Dispense: 4 capsule; Refill: 0  3. Mixed hyperlipidemia Cardiovascular risk and specific lipid/LDL goals reviewed.  We discussed several lifestyle modifications today and Tamara Meza will continue to work on diet, exercise and weight loss efforts. Orders and follow up as documented in patient record.   Counseling Intensive lifestyle modifications are the first line treatment for this issue. . Dietary changes: Increase soluble fiber. Decrease simple carbohydrates. . Exercise changes: Moderate to vigorous-intensity aerobic activity 150 minutes per week if tolerated. . Lipid-lowering medications: see documented in medical record.  4. GAD (generalized anxiety disorder) Behavior modification techniques  were discussed today to help Tamara Meza deal with her anxiety.  Orders and follow up as documented in patient record.   5. At risk for heart disease Tamara Meza was given approximately 15 minutes of coronary artery disease prevention counseling today. She is 29 y.o. female and has risk factors for heart disease including  obesity. We discussed intensive lifestyle modifications today with an emphasis on specific weight loss instructions and strategies.   Repetitive spaced learning was employed today to elicit superior memory formation and behavioral change.  6. Class 1 obesity with serious comorbidity and body mass index (BMI) of 33.0 to 33.9 in adult, unspecified obesity type Tamara Meza is currently in the action stage of change. As such, her goal is to continue with weight loss efforts. She has agreed to the Category 3 Plan.   Exercise goals: As is.  Behavioral modification strategies: increasing lean protein intake.  Tamara Meza should take her metformin in the afternoon.  She also needs to get adequate protein at dinner, and I would like her to consider Saxenda.  Tamara Meza has agreed to follow-up with our clinic in 2 weeks. She was informed of the importance of frequent follow-up visits to maximize her success with intensive lifestyle modifications for her multiple health conditions.   Objective:   Blood pressure 106/73, pulse 66, temperature (!) 97.5 F (36.4 C), temperature source Oral, height 5\' 8"  (1.727 m), weight 218 lb (98.9 kg), last menstrual period 05/19/2019, SpO2 98 %. Body mass index is 33.15 kg/m.  General: Cooperative, alert, well developed, in no acute distress. HEENT: Conjunctivae and lids unremarkable. Cardiovascular: Regular rhythm.  Lungs: Normal work of breathing. Neurologic: No focal deficits.   Lab Results  Component Value Date   CREATININE 0.70 02/18/2019   BUN 9 02/18/2019   NA 138 02/18/2019   K 4.2 02/18/2019   CL 103 02/18/2019   CO2 20 02/18/2019   Lab Results  Component Value Date   ALT 23 02/18/2019   AST 24 02/18/2019   ALKPHOS 77 02/18/2019   BILITOT 0.4 02/18/2019   Lab Results  Component Value Date   HGBA1C 5.6 02/18/2019   Lab Results  Component Value Date   INSULIN 10.7 02/18/2019   Lab Results  Component Value Date   TSH 3.540 02/18/2019   Lab Results    Component Value Date   CHOL 237 (H) 02/18/2019   HDL 64 02/18/2019   LDLCALC 136 (H) 02/18/2019   TRIG 208 (H) 02/18/2019   Lab Results  Component Value Date   WBC 8.9 02/18/2019   HGB 13.9 02/18/2019   HCT 42.1 02/18/2019   MCV 88 02/18/2019   PLT 266 02/18/2019   Attestation Statements:   Reviewed by clinician on day of visit: allergies, medications, problem list, medical history, surgical history, family history, social history, and previous encounter notes.  I, 13/06/2018, CMA, am acting as Insurance claims handler for Energy manager, DO.  I have reviewed the above documentation for accuracy and completeness, and I agree with the above. [employer], DO

## 2019-06-16 ENCOUNTER — Ambulatory Visit (INDEPENDENT_AMBULATORY_CARE_PROVIDER_SITE_OTHER): Payer: BC Managed Care – PPO | Admitting: Family Medicine

## 2019-06-16 ENCOUNTER — Encounter (INDEPENDENT_AMBULATORY_CARE_PROVIDER_SITE_OTHER): Payer: Self-pay | Admitting: Family Medicine

## 2019-06-16 ENCOUNTER — Other Ambulatory Visit: Payer: Self-pay

## 2019-06-16 VITALS — BP 104/70 | HR 68 | Temp 97.9°F | Ht 68.0 in | Wt 213.0 lb

## 2019-06-16 DIAGNOSIS — Z9189 Other specified personal risk factors, not elsewhere classified: Secondary | ICD-10-CM

## 2019-06-16 DIAGNOSIS — E669 Obesity, unspecified: Secondary | ICD-10-CM

## 2019-06-16 DIAGNOSIS — F411 Generalized anxiety disorder: Secondary | ICD-10-CM

## 2019-06-16 DIAGNOSIS — E8881 Metabolic syndrome: Secondary | ICD-10-CM | POA: Diagnosis not present

## 2019-06-16 DIAGNOSIS — E559 Vitamin D deficiency, unspecified: Secondary | ICD-10-CM

## 2019-06-16 DIAGNOSIS — E88819 Insulin resistance, unspecified: Secondary | ICD-10-CM

## 2019-06-16 DIAGNOSIS — Z6832 Body mass index (BMI) 32.0-32.9, adult: Secondary | ICD-10-CM

## 2019-06-16 DIAGNOSIS — E782 Mixed hyperlipidemia: Secondary | ICD-10-CM | POA: Diagnosis not present

## 2019-06-16 MED ORDER — VITAMIN D (ERGOCALCIFEROL) 1.25 MG (50000 UNIT) PO CAPS: 50000.0000 [IU] | ORAL_CAPSULE | ORAL | 0 refills | Status: DC

## 2019-06-16 NOTE — Progress Notes (Signed)
Chief Complaint:   OBESITY [name] is here to discuss her progress with her obesity treatment plan along with follow-up of her obesity related diagnoses. Tamara Meza is on the Category 3 Plan and states she is following her eating plan approximately 65% of the time. Tamara Meza states she is cycling, doing HIIT, and walking for 45-60 minutes 5 times per week.  Today's visit was #: 8 Starting weight: 234 lbs Starting date: 02/18/2019 Today's weight: 213 lbs Today's date: 06/16/2019 Total lbs lost to date: 21 lbs Total lbs lost since last in-office visit: 5 lbs  Interim History: Tamara Meza is making good choices during the week.  She has had parties over the weekends but mostly made good choices.  Subjective:   1. Vitamin D deficiency Tamara Meza's Vitamin D level was 26.2 on 02/18/2019. She is currently taking vit D. She denies nausea, vomiting or muscle weakness.  2. Insulin resistance Tamara Meza has a diagnosis of insulin resistance based on her elevated fasting insulin level >5. She continues to work on diet and exercise to decrease her risk of diabetes.  She stopped taking metformin due to abdominal pain.  Lab Results  Component Value Date   INSULIN 10.7 02/18/2019   Lab Results  Component Value Date   HGBA1C 5.6 02/18/2019   3. Mixed hyperlipidemia Tamara Meza has hyperlipidemia and has been trying to improve her cholesterol levels with intensive lifestyle modification including a low saturated fat diet, exercise and weight loss. She denies any chest pain, claudication or myalgias.  Lab Results  Component Value Date   ALT 23 02/18/2019   AST 24 02/18/2019   ALKPHOS 77 02/18/2019   BILITOT 0.4 02/18/2019   Lab Results  Component Value Date   CHOL 237 (H) 02/18/2019   HDL 64 02/18/2019   LDLCALC 136 (H) 02/18/2019   TRIG 208 (H) 02/18/2019   4. GAD (generalized anxiety disorder) Tamara Meza takes Zoloft for her anxiety symptoms.  Assessment/Plan:   1. Vitamin D deficiency Low Vitamin D level  contributes to fatigue and are associated with obesity, breast, and colon cancer. She agrees to continue to take prescription Vitamin D @50 ,000 IU every week and will follow-up for routine testing of Vitamin D, at least 2-3 times per year to avoid over-replacement.  Orders - Vitamin D, Ergocalciferol, (DRISDOL) 1.25 MG (50000 UNIT) CAPS capsule; Take 1 capsule (50,000 Units total) by mouth every 7 (seven) days.  Dispense: 4 capsule; Refill: 0  2. Insulin resistance Tamara Meza will continue to work on weight loss, exercise, and decreasing simple carbohydrates to help decrease the risk of diabetes. Tamara Meza agreed to follow-up with [place] as directed to closely monitor her progress.  3. Mixed hyperlipidemia Cardiovascular risk and specific lipid/LDL goals reviewed.  We discussed several lifestyle modifications today and Tamara Meza will continue to work on diet, exercise and weight loss efforts. Orders and follow up as documented in patient record.   Counseling Intensive lifestyle modifications are the first line treatment for this issue. . Dietary changes: Increase soluble fiber. Decrease simple carbohydrates. . Exercise changes: Moderate to vigorous-intensity aerobic activity 150 minutes per week if tolerated. . Lipid-lowering medications: see documented in medical record.  4. GAD (generalized anxiety disorder) Behavior modification techniques were discussed today to help Tamara Meza deal with her anxiety.  Orders and follow up as documented in patient record.   5. At risk for hyperglycemia Tamara Meza was given approximately 15 minutes of counseling today regarding prevention of hyperglycemia. She was advised of hyperglycemia causes and the fact hyperglycemia is  often asymptomatic. Tamara Meza was instructed to avoid skipping meals, eat regular protein rich meals and schedule low calorie but protein rich snacks as needed.   Repetitive spaced learning was employed today to elicit superior memory formation and behavioral  change  6. Class 1 obesity with serious comorbidity and body mass index (BMI) of 32.0 to 32.9 in adult, unspecified obesity type Tamara Meza is currently in the action stage of change. As such, her goal is to continue with weight loss efforts. She has agreed to the Category 3 Plan tracking on [employer].  Exercise goals: As is.  Behavioral modification strategies: dealing with family or coworker sabotage, avoiding temptations and keeping a strict food journal.  Tamara Meza has agreed to follow-up with our clinic in 3 weeks (labs at next visit). She was informed of the importance of frequent follow-up visits to maximize her success with intensive lifestyle modifications for her multiple health conditions.   Objective:   Blood pressure 104/70, pulse 68, temperature 97.9 F (36.6 C), temperature source Oral, height 5\' 8"  (1.727 m), weight 213 lb (96.6 kg), last menstrual period 05/19/2019, SpO2 99 %. Body mass index is 32.39 kg/m.  General: Cooperative, alert, well developed, in no acute distress. HEENT: Conjunctivae and lids unremarkable. Cardiovascular: Regular rhythm.  Lungs: Normal work of breathing. Neurologic: No focal deficits.   Lab Results  Component Value Date   CREATININE 0.70 02/18/2019   BUN 9 02/18/2019   NA 138 02/18/2019   K 4.2 02/18/2019   CL 103 02/18/2019   CO2 20 02/18/2019   Lab Results  Component Value Date   ALT 23 02/18/2019   AST 24 02/18/2019   ALKPHOS 77 02/18/2019   BILITOT 0.4 02/18/2019   Lab Results  Component Value Date   HGBA1C 5.6 02/18/2019   Lab Results  Component Value Date   INSULIN 10.7 02/18/2019   Lab Results  Component Value Date   TSH 3.540 02/18/2019   Lab Results  Component Value Date   CHOL 237 (H) 02/18/2019   HDL 64 02/18/2019   LDLCALC 136 (H) 02/18/2019   TRIG 208 (H) 02/18/2019   Lab Results  Component Value Date   WBC 8.9 02/18/2019   HGB 13.9 02/18/2019   HCT 42.1 02/18/2019   MCV 88 02/18/2019   PLT 266 02/18/2019    Attestation Statements:   Reviewed by clinician on day of visit: allergies, medications, problem list, medical history, surgical history, family history, social history, and previous encounter notes.  I, 13/06/2018, CMA, am acting as Insurance claims handler for Energy manager, DO.  I have reviewed the above documentation for accuracy and completeness, and I agree with the above. [employer], DO

## 2019-07-07 ENCOUNTER — Ambulatory Visit (INDEPENDENT_AMBULATORY_CARE_PROVIDER_SITE_OTHER): Payer: BC Managed Care – PPO | Admitting: Family Medicine

## 2019-07-10 ENCOUNTER — Other Ambulatory Visit: Payer: Self-pay

## 2019-07-10 ENCOUNTER — Ambulatory Visit (INDEPENDENT_AMBULATORY_CARE_PROVIDER_SITE_OTHER): Payer: BC Managed Care – PPO | Admitting: Family Medicine

## 2019-07-10 ENCOUNTER — Encounter (INDEPENDENT_AMBULATORY_CARE_PROVIDER_SITE_OTHER): Payer: Self-pay | Admitting: Family Medicine

## 2019-07-10 VITALS — BP 116/70 | HR 60 | Temp 97.5°F | Ht 68.0 in | Wt 203.0 lb

## 2019-07-10 DIAGNOSIS — E7849 Other hyperlipidemia: Secondary | ICD-10-CM

## 2019-07-10 DIAGNOSIS — E8881 Metabolic syndrome: Secondary | ICD-10-CM | POA: Diagnosis not present

## 2019-07-10 DIAGNOSIS — E559 Vitamin D deficiency, unspecified: Secondary | ICD-10-CM | POA: Diagnosis not present

## 2019-07-10 DIAGNOSIS — E669 Obesity, unspecified: Secondary | ICD-10-CM | POA: Diagnosis not present

## 2019-07-10 DIAGNOSIS — Z6830 Body mass index (BMI) 30.0-30.9, adult: Secondary | ICD-10-CM

## 2019-07-10 MED ORDER — VITAMIN D (ERGOCALCIFEROL) 1.25 MG (50000 UNIT) PO CAPS: 50000.0000 [IU] | ORAL_CAPSULE | ORAL | 0 refills | Status: DC

## 2019-07-10 NOTE — Progress Notes (Signed)
Chief Complaint:   OBESITY Tamara Meza is here to discuss her progress with her obesity treatment plan along with follow-up of her obesity related diagnoses. Tamara Meza is on the Category 3 Plan and states she is following her eating plan approximately 60% of the time. Tamara Meza states she is bike riding, on the treadmill, and strength training for 45-60 minutes 4-5 times per week.  Today's visit was #: 9 Starting weight: 234 lbs Starting date: 02/18/2019 Today's weight: 203 lbs Today's date: 07/10/2019 Total lbs lost to date: 31 Total lbs lost since last in-office visit: 10  Interim History: Tamara Meza had a big loss today of 10 lbs, despite not adhering to the plan very well. However, she has started tracking her intake which has made her more cognizant of her intake and food choices.  Subjective:   1. Vitamin D deficiency Tamara Meza's Vit D level is not at goal. Last Vit D level  Was 26.2 on 02/18/2019. She is on prescription Vit D.  2. Insulin resistance Tamara Meza denies polyphagia and she is not on metformin. Lab Results  Component Value Date   HGBA1C 5.1 07/10/2019  .lastdm   3. Other hyperlipidemia Carriann's last LDL was elevated at 136, HDL was low at 34, and triglycerides were within normal limits. She is not on statin. Lab Results  Component Value Date   CHOL 229 (H) 07/10/2019   HDL 65 07/10/2019   LDLCALC 151 (H) 07/10/2019   TRIG 74 07/10/2019     Assessment/Plan:   1. Vitamin D deficiency Low Vitamin D level contributes to fatigue and are associated with obesity, breast, and colon cancer. We will refill prescription Vitamin D for 1 month. Amela will follow-up for routine testing of Vitamin D, at least 2-3 times per year to avoid over-replacement. We will check labs today.  - VITAMIN D 25 Hydroxy (Vit-D Deficiency, Fractures)  - Vitamin D, Ergocalciferol, (DRISDOL) 1.25 MG (50000 UNIT) CAPS capsule; Take 1 capsule (50,000 Units total) by mouth every 7 (seven) days.  Dispense: 4  capsule; Refill: 0  2. Insulin resistance Tamara Meza will continue to work on weight loss, exercise, and decreasing simple carbohydrates to help decrease the risk of diabetes. We will check labs today. Tamara Meza agreed to follow-up with [place] as directed to closely monitor her progress.  - Hemoglobin A1c - Insulin, random  3. Other hyperlipidemia Cardiovascular risk and specific lipid/LDL goals reviewed. We discussed several lifestyle modifications today and Mackenna will continue to work on diet, exercise and weight loss efforts. We will check labs today.  - Lipid Panel With LDL/HDL Ratio  4. Class 1 obesity with serious comorbidity and body mass index (BMI) of 30.0 to 30.9 in adult, unspecified obesity type Tamara Meza is currently in the action stage of change. As such, her goal is to continue with weight loss efforts. She has agreed to the Category 3 Plan or keeping a food journal and adhering to recommended goals of 1450-1600 calories and 95 grams of protein daily.   Exercise goals: As is.  Behavioral modification strategies: increasing lean protein intake, decreasing simple carbohydrates and keeping a strict food journal.  Tamara Meza has agreed to follow-up with our clinic in 3 weeks. She was informed of the importance of frequent follow-up visits to maximize her success with intensive lifestyle modifications for her multiple health conditions.   Tamara Meza was informed we would discuss her lab results at her next visit unless there is a critical issue that needs to be addressed sooner. Tamara Meza agreed to  keep her next visit at the agreed upon time to discuss these results.  Objective:   Blood pressure 116/70, pulse 60, temperature (!) 97.5 F (36.4 C), temperature source Oral, height 5\' 8"  (1.727 m), weight 203 lb (92.1 kg), SpO2 99 %. Body mass index is 30.87 kg/m.  General: Cooperative, alert, well developed, in no acute distress. HEENT: Conjunctivae and lids unremarkable. Cardiovascular: Regular rhythm.    Lungs: Normal work of breathing. Neurologic: No focal deficits.   Lab Results  Component Value Date   CREATININE 0.70 02/18/2019   BUN 9 02/18/2019   NA 138 02/18/2019   K 4.2 02/18/2019   CL 103 02/18/2019   CO2 20 02/18/2019   Lab Results  Component Value Date   ALT 23 02/18/2019   AST 24 02/18/2019   ALKPHOS 77 02/18/2019   BILITOT 0.4 02/18/2019   Lab Results  Component Value Date   HGBA1C 5.6 02/18/2019   Lab Results  Component Value Date   INSULIN 10.7 02/18/2019   Lab Results  Component Value Date   TSH 3.540 02/18/2019   Lab Results  Component Value Date   CHOL 237 (H) 02/18/2019   HDL 64 02/18/2019   LDLCALC 136 (H) 02/18/2019   TRIG 208 (H) 02/18/2019   Lab Results  Component Value Date   WBC 8.9 02/18/2019   HGB 13.9 02/18/2019   HCT 42.1 02/18/2019   MCV 88 02/18/2019   PLT 266 02/18/2019   No results found for: IRON, TIBC, FERRITIN  Attestation Statements:   Reviewed by clinician on day of visit: allergies, medications, problem list, medical history, surgical history, family history, social history, and previous encounter notes.   Wilhemena Durie, am acting as Location manager for [employer], FNP-C.  I have reviewed the above documentation for accuracy and completeness, and I agree with the above. -  Georgianne Fick, FNP

## 2019-07-11 DIAGNOSIS — M9904 Segmental and somatic dysfunction of sacral region: Secondary | ICD-10-CM | POA: Diagnosis not present

## 2019-07-11 DIAGNOSIS — M9905 Segmental and somatic dysfunction of pelvic region: Secondary | ICD-10-CM | POA: Diagnosis not present

## 2019-07-11 DIAGNOSIS — M9903 Segmental and somatic dysfunction of lumbar region: Secondary | ICD-10-CM | POA: Diagnosis not present

## 2019-07-11 DIAGNOSIS — M9902 Segmental and somatic dysfunction of thoracic region: Secondary | ICD-10-CM | POA: Diagnosis not present

## 2019-07-11 LAB — LIPID PANEL WITH LDL/HDL RATIO
Cholesterol, Total: 229 mg/dL — ABNORMAL HIGH (ref 100–199)
HDL: 65 mg/dL (ref >39)
LDL Chol Calc (NIH): 151 mg/dL — ABNORMAL HIGH (ref 0–99)
LDL/HDL Ratio: 2.3 ratio (ref 0.0–3.2)
Triglycerides: 74 mg/dL (ref 0–149)
VLDL Cholesterol Cal: 13 mg/dL (ref 5–40)

## 2019-07-11 LAB — HEMOGLOBIN A1C
Est. average glucose Bld gHb Est-mCnc: 100 mg/dL
Hgb A1c MFr Bld: 5.1 % (ref 4.8–5.6)

## 2019-07-11 LAB — INSULIN, RANDOM: INSULIN: 4.2 u[IU]/mL (ref 2.6–24.9)

## 2019-07-11 LAB — VITAMIN D 25 HYDROXY (VIT D DEFICIENCY, FRACTURES): Vit D, 25-Hydroxy: 42.3 ng/mL (ref 30.0–100.0)

## 2019-07-14 ENCOUNTER — Encounter (INDEPENDENT_AMBULATORY_CARE_PROVIDER_SITE_OTHER): Payer: Self-pay | Admitting: Family Medicine

## 2019-07-14 DIAGNOSIS — E669 Obesity, unspecified: Secondary | ICD-10-CM | POA: Insufficient documentation

## 2019-07-14 DIAGNOSIS — E66811 Obesity, class 1: Secondary | ICD-10-CM | POA: Insufficient documentation

## 2019-07-30 ENCOUNTER — Encounter (INDEPENDENT_AMBULATORY_CARE_PROVIDER_SITE_OTHER): Payer: Self-pay | Admitting: Family Medicine

## 2019-07-30 ENCOUNTER — Ambulatory Visit (INDEPENDENT_AMBULATORY_CARE_PROVIDER_SITE_OTHER): Payer: BC Managed Care – PPO | Admitting: Family Medicine

## 2019-07-30 ENCOUNTER — Other Ambulatory Visit: Payer: Self-pay

## 2019-07-30 VITALS — BP 116/70 | HR 80 | Temp 98.1°F | Ht 68.0 in | Wt 202.0 lb

## 2019-07-30 DIAGNOSIS — E7849 Other hyperlipidemia: Secondary | ICD-10-CM

## 2019-07-30 DIAGNOSIS — F908 Attention-deficit hyperactivity disorder, other type: Secondary | ICD-10-CM | POA: Diagnosis not present

## 2019-07-30 DIAGNOSIS — E559 Vitamin D deficiency, unspecified: Secondary | ICD-10-CM

## 2019-07-30 DIAGNOSIS — G4710 Hypersomnia, unspecified: Secondary | ICD-10-CM

## 2019-07-30 DIAGNOSIS — Z6830 Body mass index (BMI) 30.0-30.9, adult: Secondary | ICD-10-CM

## 2019-07-30 DIAGNOSIS — E669 Obesity, unspecified: Secondary | ICD-10-CM

## 2019-07-30 MED ORDER — VITAMIN D (ERGOCALCIFEROL) 1.25 MG (50000 UNIT) PO CAPS: 50000.0000 [IU] | ORAL_CAPSULE | ORAL | 0 refills | Status: DC

## 2019-07-30 MED ORDER — LISDEXAMFETAMINE DIMESYLATE 40 MG PO CAPS: 40.0000 mg | ORAL_CAPSULE | ORAL | 0 refills | Status: DC

## 2019-07-30 NOTE — Progress Notes (Signed)
Chief Complaint:   OBESITY Tamara Meza is here to discuss her progress with her obesity treatment plan along with follow-up of her obesity related diagnoses. Tamara Meza is on the Category 3 Plan and states she is following her eating plan approximately 50% of the time. Tamara Meza states she is biking/strength training/HIIT for 45-60 minutes 5-6 times per week.  Today's visit was #: 10 Starting weight: 234 lbs Starting date: 02/18/2019 Today's weight: 202 lbs Today's date: 07/30/2019 Total lbs lost to date: 32 lbs Total lbs lost since last in-office visit: 1 lb  Interim History: Tamara Meza says she has a big test coming up in 2 weeks.  She reports being strict during the week and off plan on the weekend.  Subjective:   1. Attention deficit hyperactivity disorder (ADHD) Tamara Meza has trouble focusing, worse with studying.   2. Other hyperlipidemia Tamara Meza has hyperlipidemia and has been trying to improve her cholesterol levels with intensive lifestyle modification including a low saturated fat diet, exercise and weight loss. She denies any chest pain, claudication or myalgias.  Question familial.  Lab Results  Component Value Date   ALT 23 02/18/2019   AST 24 02/18/2019   ALKPHOS 77 02/18/2019   BILITOT 0.4 02/18/2019   Lab Results  Component Value Date   CHOL 229 (H) 07/10/2019   HDL 65 07/10/2019   LDLCALC 151 (H) 07/10/2019   TRIG 74 07/10/2019   3. Vitamin D deficiency Tamara Meza's Vitamin D level was 42.3 on 07/10/2019. She is currently taking prescription vitamin D 50,000 IU each week. She denies nausea, vomiting or muscle weakness.  4. Hypersomnolence Tamara Meza reports having excessive sleepiness.  Assessment/Plan:   1. Attention deficit hyperactivity disorder (ADHD) Will send in Vyvanse 40 mg daily, as per below.  Orders - lisdexamfetamine (VYVANSE) 40 MG capsule; Take 1 capsule (40 mg total) by mouth every morning.  Dispense: 30 capsule; Refill: 0  2. Other hyperlipidemia Cardiovascular  risk and specific lipid/LDL goals reviewed.  We discussed several lifestyle modifications today and Tamara Meza will continue to work on diet, exercise and weight loss efforts. Orders and follow up as documented in patient record.   Counseling Intensive lifestyle modifications are the first line treatment for this issue. . Dietary changes: Increase soluble fiber. Decrease simple carbohydrates. . Exercise changes: Moderate to vigorous-intensity aerobic activity 150 minutes per week if tolerated. . Lipid-lowering medications: see documented in medical record.  3. Vitamin D deficiency Low Vitamin D level contributes to fatigue and are associated with obesity, breast, and colon cancer. She agrees to continue to take prescription Vitamin D @50 ,000 IU every week and will follow-up for routine testing of Vitamin D, at least 2-3 times per year to avoid over-replacement.  Orders - Vitamin D, Ergocalciferol, (DRISDOL) 1.25 MG (50000 UNIT) CAPS capsule; Take 1 capsule (50,000 Units total) by mouth every 7 (seven) days.  Dispense: 4 capsule; Refill: 0  4. Hypersomnolence We will continue to monitor. Orders and follow up as documented in patient record.  5. Class 1 obesity with serious comorbidity and body mass index (BMI) of 30.0 to 30.9 in adult, unspecified obesity type Tamara Meza is currently in the action stage of change. As such, her goal is to continue with weight loss efforts. She has agreed to keeping a food journal and adhering to recommended goals of 2000 calories and 125+ grams of protein.   Exercise goals: As is.  Behavioral modification strategies: increasing water intake.  Tamara Meza has agreed to follow-up with our clinic in 2-3 weeks.  She was informed of the importance of frequent follow-up visits to maximize her success with intensive lifestyle modifications for her multiple health conditions.   Objective:   Blood pressure 116/70, pulse 80, temperature 98.1 F (36.7 C), temperature source Oral,  height 5\' 8"  (1.727 m), weight 202 lb (91.6 kg), SpO2 99 %. Body mass index is 30.71 kg/m.  General: Cooperative, alert, well developed, in no acute distress. HEENT: Conjunctivae and lids unremarkable. Cardiovascular: Regular rhythm.  Lungs: Normal work of breathing. Neurologic: No focal deficits.   Lab Results  Component Value Date   CREATININE 0.70 02/18/2019   BUN 9 02/18/2019   NA 138 02/18/2019   K 4.2 02/18/2019   CL 103 02/18/2019   CO2 20 02/18/2019   Lab Results  Component Value Date   ALT 23 02/18/2019   AST 24 02/18/2019   ALKPHOS 77 02/18/2019   BILITOT 0.4 02/18/2019   Lab Results  Component Value Date   HGBA1C 5.1 07/10/2019   HGBA1C 5.6 02/18/2019   Lab Results  Component Value Date   INSULIN 4.2 07/10/2019   INSULIN 10.7 02/18/2019   Lab Results  Component Value Date   TSH 3.540 02/18/2019   Lab Results  Component Value Date   CHOL 229 (H) 07/10/2019   HDL 65 07/10/2019   LDLCALC 151 (H) 07/10/2019   TRIG 74 07/10/2019   Lab Results  Component Value Date   WBC 8.9 02/18/2019   HGB 13.9 02/18/2019   HCT 42.1 02/18/2019   MCV 88 02/18/2019   PLT 266 02/18/2019   Attestation Statements:   Reviewed by clinician on day of visit: allergies, medications, problem list, medical history, surgical history, family history, social history, and previous encounter notes.  I, Water quality scientist, CMA, am acting as Location manager for [employer], DO.  I have reviewed the above documentation for accuracy and completeness, and I agree with the above. Briscoe Deutscher, DO

## 2019-08-21 ENCOUNTER — Ambulatory Visit (INDEPENDENT_AMBULATORY_CARE_PROVIDER_SITE_OTHER): Payer: BC Managed Care – PPO | Admitting: Family Medicine

## 2019-08-27 ENCOUNTER — Other Ambulatory Visit: Payer: Self-pay

## 2019-08-27 ENCOUNTER — Encounter (INDEPENDENT_AMBULATORY_CARE_PROVIDER_SITE_OTHER): Payer: Self-pay | Admitting: Family Medicine

## 2019-08-27 ENCOUNTER — Ambulatory Visit (INDEPENDENT_AMBULATORY_CARE_PROVIDER_SITE_OTHER): Payer: BC Managed Care – PPO | Admitting: Family Medicine

## 2019-08-27 VITALS — BP 103/69 | HR 67 | Temp 97.9°F | Ht 68.0 in | Wt 201.0 lb

## 2019-08-27 DIAGNOSIS — F908 Attention-deficit hyperactivity disorder, other type: Secondary | ICD-10-CM

## 2019-08-27 DIAGNOSIS — E669 Obesity, unspecified: Secondary | ICD-10-CM | POA: Diagnosis not present

## 2019-08-27 DIAGNOSIS — E559 Vitamin D deficiency, unspecified: Secondary | ICD-10-CM

## 2019-08-27 DIAGNOSIS — Z9189 Other specified personal risk factors, not elsewhere classified: Secondary | ICD-10-CM | POA: Diagnosis not present

## 2019-08-27 DIAGNOSIS — Z6830 Body mass index (BMI) 30.0-30.9, adult: Secondary | ICD-10-CM

## 2019-08-27 MED ORDER — BUPROPION HCL ER (SR) 150 MG PO TB12: 150.0000 mg | ORAL_TABLET | Freq: Two times a day (BID) | ORAL | 0 refills | Status: DC

## 2019-08-27 MED ORDER — VITAMIN D (ERGOCALCIFEROL) 1.25 MG (50000 UNIT) PO CAPS: 50000.0000 [IU] | ORAL_CAPSULE | ORAL | 0 refills | Status: DC

## 2019-08-27 MED ORDER — LISDEXAMFETAMINE DIMESYLATE 60 MG PO CAPS: 60.0000 mg | ORAL_CAPSULE | Freq: Every day | ORAL | 0 refills | Status: DC

## 2019-08-27 NOTE — Progress Notes (Signed)
Chief Complaint:   OBESITY Tamara Meza is here to discuss her progress with her obesity treatment plan along with follow-up of her obesity related diagnoses. Tamara Meza is on keeping a food journal and adhering to recommended goals of 2000 calories and 125+ grams of protein and states she is following her eating plan approximately 25% of the time. Tamara Meza states she is walking/HIIT for 45-60 minutes 5 times per week.  Today's visit was #: 11 Starting weight: 234 lbs Starting date: 02/18/2019 Today's weight: 201 lbs Today's date: 08/27/2019 Total lbs lost to date: 33 lbs Total lbs lost since last in-office visit: 1 lb  Interim History: Tamara Meza is finished with school. She says she likes the Vyvanse but still needs afternoon Adderall.  She is still very tired, even after sleeping 9 hours each night.  Subjective:   1. Vitamin D deficiency Tamara Meza's Vitamin D level was 42.3 on 07/10/2019. She is currently taking prescription vitamin D 50,000 IU each week. She denies nausea, vomiting or muscle weakness.  2. Attention deficit hyperactivity disorder (ADHD), BED Tamara Meza is currently taking Vyvanse 40 mg daily and Adderall 12.5 mg in the afternoon.  3. At risk for constipation Tamara Meza is at increased risk for constipation due to inadequate water intake, changes in diet, and/or use of medications such as GLP1 agonists. Tamara Meza denies hard, infrequent stools currently.   Assessment/Plan:   1. Vitamin D deficiency Low Vitamin D level contributes to fatigue and are associated with obesity, breast, and colon cancer. She agrees to continue to take prescription Vitamin D @50 ,000 IU every week and will follow-up for routine testing of Vitamin D, at least 2-3 times per year to avoid over-replacement.  Orders - Vitamin D, Ergocalciferol, (DRISDOL) 1.25 MG (50000 UNIT) CAPS capsule; Take 1 capsule (50,000 Units total) by mouth every 7 (seven) days.  Dispense: 4 capsule; Refill: 0  2. Attention deficit hyperactivity  disorder (ADHD), BED Will increase Vyvanse to 60 mg daily and start Wellbutrin 150 mg daily combat current symptoms.  Orders - buPROPion (WELLBUTRIN SR) 150 MG 12 hr tablet; Take 1 tablet (150 mg total) by mouth 2 (two) times daily.  Dispense: 30 tablet; Refill: 0 - lisdexamfetamine (VYVANSE) 60 MG capsule; Take 1 capsule (60 mg total) by mouth daily.  Dispense: 30 capsule; Refill: 0  3. At risk for constipation Tamara Meza was given approximately 15 minutes of counseling today regarding prevention of constipation. She was encouraged to increase water and fiber intake.   4. Class 1 obesity with serious comorbidity and body mass index (BMI) of 30.0 to 30.9 in adult, unspecified obesity type Tamara Meza is currently in the action stage of change. As such, her goal is to continue with weight loss efforts. She has agreed to keeping a food journal and adhering to recommended goals of 2000 calories and 125+ grams of protein.   Exercise goals: For substantial health benefits, adults should do at least 150 minutes (2 hours and 30 minutes) a week of moderate-intensity, or 75 minutes (1 hour and 15 minutes) a week of vigorous-intensity aerobic physical activity, or an equivalent combination of moderate- and vigorous-intensity aerobic activity. Aerobic activity should be performed in episodes of at least 10 minutes, and preferably, it should be spread throughout the week.  Behavioral modification strategies: increasing water intake.  Tamara Meza has agreed to follow-up with our clinic in 2 weeks. She was informed of the importance of frequent follow-up visits to maximize her success with intensive lifestyle modifications for her multiple health conditions.  Objective:   Blood pressure 103/69, pulse 67, temperature 97.9 F (36.6 C), temperature source Oral, height 5\' 8"  (1.727 m), weight 201 lb (91.2 kg), SpO2 99 %. Body mass index is 30.56 kg/m.  General: Cooperative, alert, well developed, in no acute  distress. HEENT: Conjunctivae and lids unremarkable. Cardiovascular: Regular rhythm.  Lungs: Normal work of breathing. Neurologic: No focal deficits.   Lab Results  Component Value Date   CREATININE 0.70 02/18/2019   BUN 9 02/18/2019   NA 138 02/18/2019   K 4.2 02/18/2019   CL 103 02/18/2019   CO2 20 02/18/2019   Lab Results  Component Value Date   ALT 23 02/18/2019   AST 24 02/18/2019   ALKPHOS 77 02/18/2019   BILITOT 0.4 02/18/2019   Lab Results  Component Value Date   HGBA1C 5.1 07/10/2019   HGBA1C 5.6 02/18/2019   Lab Results  Component Value Date   INSULIN 4.2 07/10/2019   INSULIN 10.7 02/18/2019   Lab Results  Component Value Date   TSH 3.540 02/18/2019   Lab Results  Component Value Date   CHOL 229 (H) 07/10/2019   HDL 65 07/10/2019   LDLCALC 151 (H) 07/10/2019   TRIG 74 07/10/2019   Lab Results  Component Value Date   WBC 8.9 02/18/2019   HGB 13.9 02/18/2019   HCT 42.1 02/18/2019   MCV 88 02/18/2019   PLT 266 02/18/2019   Attestation Statements:   Reviewed by clinician on day of visit: allergies, medications, problem list, medical history, surgical history, family history, social history, and previous encounter notes.  I, Water quality scientist, CMA, am acting as Location manager for [employer], DO.  I have reviewed the above documentation for accuracy and completeness, and I agree with the above. Briscoe Deutscher, DO

## 2019-08-28 ENCOUNTER — Other Ambulatory Visit (INDEPENDENT_AMBULATORY_CARE_PROVIDER_SITE_OTHER): Payer: Self-pay | Admitting: *Deleted

## 2019-08-28 DIAGNOSIS — F908 Attention-deficit hyperactivity disorder, other type: Secondary | ICD-10-CM

## 2019-08-28 MED ORDER — VYVANSE 60 MG PO CHEW: 1.0000 | CHEWABLE_TABLET | Freq: Every day | ORAL | 0 refills | Status: DC

## 2019-08-28 NOTE — Telephone Encounter (Signed)
Please advise. Thanks.  

## 2019-08-28 NOTE — Telephone Encounter (Signed)
Done.  Waiting on your sig.

## 2019-09-24 ENCOUNTER — Encounter (INDEPENDENT_AMBULATORY_CARE_PROVIDER_SITE_OTHER): Payer: Self-pay | Admitting: Family Medicine

## 2019-09-24 NOTE — Telephone Encounter (Signed)
Please advise. Thanks.  

## 2019-09-25 ENCOUNTER — Other Ambulatory Visit (INDEPENDENT_AMBULATORY_CARE_PROVIDER_SITE_OTHER): Payer: Self-pay | Admitting: *Deleted

## 2019-09-25 DIAGNOSIS — F908 Attention-deficit hyperactivity disorder, other type: Secondary | ICD-10-CM

## 2019-09-25 MED ORDER — BUPROPION HCL ER (SR) 150 MG PO TB12: 150.0000 mg | ORAL_TABLET | Freq: Two times a day (BID) | ORAL | 0 refills | Status: DC

## 2019-09-29 ENCOUNTER — Encounter (INDEPENDENT_AMBULATORY_CARE_PROVIDER_SITE_OTHER): Payer: Self-pay | Admitting: Family Medicine

## 2019-09-29 ENCOUNTER — Other Ambulatory Visit: Payer: Self-pay

## 2019-09-29 ENCOUNTER — Ambulatory Visit (INDEPENDENT_AMBULATORY_CARE_PROVIDER_SITE_OTHER): Payer: BC Managed Care – PPO | Admitting: Family Medicine

## 2019-09-29 VITALS — BP 108/61 | HR 71 | Temp 98.1°F | Ht 68.0 in | Wt 203.0 lb

## 2019-09-29 DIAGNOSIS — F988 Other specified behavioral and emotional disorders with onset usually occurring in childhood and adolescence: Secondary | ICD-10-CM | POA: Diagnosis not present

## 2019-09-29 DIAGNOSIS — G478 Other sleep disorders: Secondary | ICD-10-CM | POA: Diagnosis not present

## 2019-09-29 DIAGNOSIS — Z6831 Body mass index (BMI) 31.0-31.9, adult: Secondary | ICD-10-CM

## 2019-09-29 DIAGNOSIS — E559 Vitamin D deficiency, unspecified: Secondary | ICD-10-CM

## 2019-09-29 DIAGNOSIS — E669 Obesity, unspecified: Secondary | ICD-10-CM | POA: Diagnosis not present

## 2019-09-29 DIAGNOSIS — Z9189 Other specified personal risk factors, not elsewhere classified: Secondary | ICD-10-CM

## 2019-09-29 MED ORDER — TOPIRAMATE 25 MG PO TABS: 25.0000 mg | ORAL_TABLET | Freq: Every day | ORAL | 0 refills | Status: DC

## 2019-09-30 NOTE — Progress Notes (Signed)
Chief Complaint:   OBESITY Tamara Meza is here to discuss her progress with her obesity treatment plan along with follow-up of her obesity related diagnoses. Tamara Meza is on the Category 3 Plan and states she is following her eating plan approximately 10% of the time. Tamara Meza states she is doing cardio and strength training for 30-60 minutes 4-5 times per week.  Today's visit was #: 12 Starting weight: 234 lbs Starting date: 02/18/2019 Today's weight: 203 lbs Today's date: 09/29/2019 Total lbs lost to date: 31 lbs Total lbs lost since last in-office visit: 0  Interim History: Tamara Meza says she has had increased night eating and has been drinking more alcohol (not excessive).  She is not eating enough at lunch.  She feels that Vyvanse and Wellbutrin are still helpful.  Subjective:   1. Nocturnal sleep-related eating disorder Tamara Meza has night eating disorder and states that her night eating has increased recently.  2. Attention deficit disorder Tamara Meza takes Vyvance for her ADD and binge eating disorder.  3. At risk for adverse drug interaction Tamara Meza is at risk for an adverse drug interaction due to starting Topamax.  Assessment/Plan:   1. Nocturnal sleep-related eating disorder Tamara Meza will start Topamax, as per below.  Orders - topiramate (TOPAMAX) 25 MG tablet; Take 1 tablet (25 mg total) by mouth daily with supper.  Dispense: 30 tablet; Refill: 0  2. Attention deficit disorder Continue Vyvanse with no change.  3. At risk for adverse drug interaction Tamara Meza was given approximately 15 minutes of drug side effect counseling today.  We discussed side effect possibility and risk versus benefits. Tamara Meza agreed to the medication and will contact this office if these side effects are intolerable.  Repetitive spaced learning was employed today to elicit superior memory formation and behavioral change.  4. Class 1 obesity with serious comorbidity and body mass index (BMI) of 31.0 to 31.9 in adult,  unspecified obesity type Tamara Meza is currently in the action stage of change. As such, her goal is to continue with weight loss efforts. She has agreed to the Category 3 Plan.   Exercise goals: For substantial health benefits, adults should do at least 150 minutes (2 hours and 30 minutes) a week of moderate-intensity, or 75 minutes (1 hour and 15 minutes) a week of vigorous-intensity aerobic physical activity, or an equivalent combination of moderate- and vigorous-intensity aerobic activity. Aerobic activity should be performed in episodes of at least 10 minutes, and preferably, it should be spread throughout the week.  Behavioral modification strategies: decreasing liquid calories and decreasing alcohol intake.  Tamara Meza has agreed to follow-up with our clinic in 2-3 weeks. She was informed of the importance of frequent follow-up visits to maximize her success with intensive lifestyle modifications for her multiple health conditions.   Objective:   Blood pressure 108/61, pulse 71, temperature 98.1 F (36.7 C), temperature source Oral, height 5\' 8"  (1.727 m), weight 203 lb (92.1 kg), SpO2 99 %. Body mass index is 30.87 kg/m.  General: Cooperative, alert, well developed, in no acute distress. HEENT: Conjunctivae and lids unremarkable. Cardiovascular: Regular rhythm.  Lungs: Normal work of breathing. Neurologic: No focal deficits.   Lab Results  Component Value Date   CREATININE 0.70 02/18/2019   BUN 9 02/18/2019   NA 138 02/18/2019   K 4.2 02/18/2019   CL 103 02/18/2019   CO2 20 02/18/2019   Lab Results  Component Value Date   ALT 23 02/18/2019   AST 24 02/18/2019   ALKPHOS 77 02/18/2019  BILITOT 0.4 02/18/2019   Lab Results  Component Value Date   HGBA1C 5.1 07/10/2019   HGBA1C 5.6 02/18/2019   Lab Results  Component Value Date   INSULIN 4.2 07/10/2019   INSULIN 10.7 02/18/2019   Lab Results  Component Value Date   TSH 3.540 02/18/2019   Lab Results  Component Value  Date   CHOL 229 (H) 07/10/2019   HDL 65 07/10/2019   LDLCALC 151 (H) 07/10/2019   TRIG 74 07/10/2019   Lab Results  Component Value Date   WBC 8.9 02/18/2019   HGB 13.9 02/18/2019   HCT 42.1 02/18/2019   MCV 88 02/18/2019   PLT 266 02/18/2019   Attestation Statements:   Reviewed by clinician on day of visit: allergies, medications, problem list, medical history, surgical history, family history, social history, and previous encounter notes.  I, Insurance claims handler, CMA, am acting as transcriptionist for Helane Rima, DO  I have reviewed the above documentation for accuracy and completeness, and I agree with the above. Helane Rima, DO

## 2019-10-01 ENCOUNTER — Encounter (INDEPENDENT_AMBULATORY_CARE_PROVIDER_SITE_OTHER): Payer: Self-pay | Admitting: Family Medicine

## 2019-10-01 MED ORDER — VYVANSE 60 MG PO CHEW: 1.0000 | CHEWABLE_TABLET | Freq: Every day | ORAL | 0 refills | Status: DC

## 2019-10-01 MED ORDER — VITAMIN D (ERGOCALCIFEROL) 1.25 MG (50000 UNIT) PO CAPS: 50000.0000 [IU] | ORAL_CAPSULE | ORAL | 0 refills | Status: DC

## 2019-10-23 ENCOUNTER — Ambulatory Visit (INDEPENDENT_AMBULATORY_CARE_PROVIDER_SITE_OTHER): Payer: BC Managed Care – PPO | Admitting: Family Medicine

## 2019-10-23 ENCOUNTER — Other Ambulatory Visit: Payer: Self-pay

## 2019-10-23 VITALS — BP 100/67 | HR 74 | Temp 97.9°F | Ht 68.0 in | Wt 200.0 lb

## 2019-10-23 DIAGNOSIS — Z9189 Other specified personal risk factors, not elsewhere classified: Secondary | ICD-10-CM | POA: Diagnosis not present

## 2019-10-23 DIAGNOSIS — E669 Obesity, unspecified: Secondary | ICD-10-CM

## 2019-10-23 DIAGNOSIS — E559 Vitamin D deficiency, unspecified: Secondary | ICD-10-CM | POA: Diagnosis not present

## 2019-10-23 DIAGNOSIS — F908 Attention-deficit hyperactivity disorder, other type: Secondary | ICD-10-CM

## 2019-10-23 DIAGNOSIS — Z6830 Body mass index (BMI) 30.0-30.9, adult: Secondary | ICD-10-CM

## 2019-10-23 MED ORDER — VYVANSE 60 MG PO CHEW: 1.0000 | CHEWABLE_TABLET | Freq: Every day | ORAL | 0 refills | Status: DC

## 2019-10-23 NOTE — Progress Notes (Signed)
Chief Complaint:   OBESITY Tamara Meza is here to discuss her progress with her obesity treatment plan along with follow-up of her obesity related diagnoses. Tamara Meza is on the Category 3 Plan and states she is following her eating plan approximately 50% of the time. Tamara Meza states she is doing strength training, HIIT, and walking for 45 minutes 4 times per week.  Today's visit was #: 13 Starting weight: 234 lbs Starting date: 02/18/2019 Today's weight: 200 lbs Today's date: 10/23/2019 Total lbs lost to date: 34 lbs Total lbs lost since last in-office visit: 3 lbs  Interim History: Tamara Meza is doing well.  She says that making healthy choices is easier with her current roommate.  Subjective:   1. Vitamin D deficiency Tamara Meza's Vitamin D level was 42.3 on 07/10/2019. She is currently taking OTC vitamin D 2000 IU each day. She denies nausea, vomiting or muscle weakness.  2. Attention deficit hyperactivity disorder (ADHD), BED Tamara Meza is taking Vyvanse 60 mg daily.  Assessment/Plan:   1. Vitamin D deficiency Low Vitamin D level contributes to fatigue and are associated with obesity, breast, and colon cancer. She agrees to continue to take OTC Vitamin D @2 ,000 IU daily and will follow-up for routine testing of Vitamin D, at least 2-3 times per year to avoid over-replacement.  2. Attention deficit hyperactivity disorder (ADHD), BED Will refill Vyvanse, as per below.  Orders - Lisdexamfetamine Dimesylate (VYVANSE) 60 MG CHEW; Chew 1 tablet by mouth daily.  Dispense: 30 tablet; Refill: 0  3. At risk for deficient intake of food Tamara Meza was given approximately 15 minutes of deficit intake of food prevention counseling today. Damyia is at risk for eating too few calories based on current food recall. She was encouraged to focus on meeting caloric and protein goals according to her recommended meal plan.   4. Class 1 obesity with serious comorbidity and body mass index (BMI) of 30.0 to 30.9 in adult,  unspecified obesity type Tamara Meza is currently in the action stage of change. As such, her goal is to continue with weight loss efforts. She has agreed to the Category 3 Plan.   Exercise goals: For substantial health benefits, adults should do at least 150 minutes (2 hours and 30 minutes) a week of moderate-intensity, or 75 minutes (1 hour and 15 minutes) a week of vigorous-intensity aerobic physical activity, or an equivalent combination of moderate- and vigorous-intensity aerobic activity. Aerobic activity should be performed in episodes of at least 10 minutes, and preferably, it should be spread throughout the week.  Behavioral modification strategies: increasing lean protein intake.  Tamara Meza has agreed to follow-up with our clinic in 3 weeks. She was informed of the importance of frequent follow-up visits to maximize her success with intensive lifestyle modifications for her multiple health conditions.   Objective:   Blood pressure 100/67, pulse 74, temperature 97.9 F (36.6 C), temperature source Oral, height 5\' 8"  (1.727 m), weight 200 lb (90.7 kg), SpO2 98 %. Body mass index is 30.41 kg/m.  General: Cooperative, alert, well developed, in no acute distress. HEENT: Conjunctivae and lids unremarkable. Cardiovascular: Regular rhythm.  Lungs: Normal work of breathing. Neurologic: No focal deficits.   Lab Results  Component Value Date   CREATININE 0.70 02/18/2019   BUN 9 02/18/2019   NA 138 02/18/2019   K 4.2 02/18/2019   CL 103 02/18/2019   CO2 20 02/18/2019   Lab Results  Component Value Date   ALT 23 02/18/2019   AST 24 02/18/2019  ALKPHOS 77 02/18/2019   BILITOT 0.4 02/18/2019   Lab Results  Component Value Date   HGBA1C 5.1 07/10/2019   HGBA1C 5.6 02/18/2019   Lab Results  Component Value Date   INSULIN 4.2 07/10/2019   INSULIN 10.7 02/18/2019   Lab Results  Component Value Date   TSH 3.540 02/18/2019   Lab Results  Component Value Date   CHOL 229 (H)  07/10/2019   HDL 65 07/10/2019   LDLCALC 151 (H) 07/10/2019   TRIG 74 07/10/2019   Lab Results  Component Value Date   WBC 8.9 02/18/2019   HGB 13.9 02/18/2019   HCT 42.1 02/18/2019   MCV 88 02/18/2019   PLT 266 02/18/2019   Attestation Statements:   Reviewed by clinician on day of visit: allergies, medications, problem list, medical history, surgical history, family history, social history, and previous encounter notes.  I, Insurance claims handler, CMA, am acting as transcriptionist for Helane Rima, DO  I have reviewed the above documentation for accuracy and completeness, and I agree with the above. Helane Rima, DO

## 2019-10-30 ENCOUNTER — Encounter (INDEPENDENT_AMBULATORY_CARE_PROVIDER_SITE_OTHER): Payer: Self-pay | Admitting: Family Medicine

## 2019-11-11 ENCOUNTER — Encounter (INDEPENDENT_AMBULATORY_CARE_PROVIDER_SITE_OTHER): Payer: Self-pay

## 2019-11-17 ENCOUNTER — Ambulatory Visit (INDEPENDENT_AMBULATORY_CARE_PROVIDER_SITE_OTHER): Payer: BC Managed Care – PPO | Admitting: Family Medicine

## 2019-11-17 ENCOUNTER — Encounter (INDEPENDENT_AMBULATORY_CARE_PROVIDER_SITE_OTHER): Payer: Self-pay | Admitting: Family Medicine

## 2019-11-17 ENCOUNTER — Other Ambulatory Visit: Payer: Self-pay

## 2019-11-17 VITALS — BP 127/82 | HR 74 | Temp 97.6°F | Ht 68.0 in | Wt 198.0 lb

## 2019-11-17 DIAGNOSIS — F908 Attention-deficit hyperactivity disorder, other type: Secondary | ICD-10-CM

## 2019-11-17 DIAGNOSIS — F419 Anxiety disorder, unspecified: Secondary | ICD-10-CM

## 2019-11-17 DIAGNOSIS — Z6830 Body mass index (BMI) 30.0-30.9, adult: Secondary | ICD-10-CM

## 2019-11-17 DIAGNOSIS — Z9189 Other specified personal risk factors, not elsewhere classified: Secondary | ICD-10-CM | POA: Diagnosis not present

## 2019-11-17 DIAGNOSIS — F329 Major depressive disorder, single episode, unspecified: Secondary | ICD-10-CM

## 2019-11-17 DIAGNOSIS — F32A Depression, unspecified: Secondary | ICD-10-CM

## 2019-11-17 DIAGNOSIS — E669 Obesity, unspecified: Secondary | ICD-10-CM

## 2019-11-17 MED ORDER — SERTRALINE HCL 50 MG PO TABS: 50.0000 mg | ORAL_TABLET | Freq: Every day | ORAL | 0 refills | Status: DC

## 2019-11-18 NOTE — Progress Notes (Signed)
Chief Complaint:   OBESITY Tamara Meza is here to discuss her progress with her obesity treatment plan along with follow-up of her obesity related diagnoses. Tamara Meza is on the Category 3 Plan and states she is following her eating plan approximately 50% of the time. Tamara Meza states she is doing strengthening, HIIT, bikin, walk for 45 minutes 4-5 times per week.  Today's visit was #: 14 Starting weight: 234 lbs Starting date: 02/18/2019 Today's weight: 198 lbs Today's date: 11/17/2019 Total lbs lost to date: 36 Total lbs lost since last in-office visit: 2  Interim History: Tamara Meza moved to [place] and is coming here monthly. Her goal is to track on [employer] but she was slightly unclear as to what her calorie goal was and what her protein goal was. She is getting between 97 and 130 grams of protein per week since her last appointment.  Subjective:   1. Attention deficit hyperactivity disorder (ADHD), BED Tamara Meza was previously on Adderal but she stopped when taking Vyvanse. Her symptoms are well controlled on Vyvanse.  2. Anxiety and depression Tamara Meza is on sertraline 100 mg but she would like to taper off as she feels anxiety wise she is well controlled.  3. At risk for anxiety Tamara Meza is at risk of developing anxiety due to decrease in Zoloft dosage.  Assessment/Plan:   1. Attention deficit hyperactivity disorder (ADHD), BED Tamara Meza will continue Vyvanse, and will follow up as directed.  2. Anxiety and depression Behavior modification techniques were discussed today to help Tamara Meza deal with her anxiety. We will refill sertraline for 1 month. Orders and follow up as documented in patient record.   - sertraline (ZOLOFT) 50 MG tablet; Take 1 tablet (50 mg total) by mouth daily.  Dispense: 45 tablet; Refill: 0  3. At risk for anxiety Tamara Meza was given approximately 15 minutes of anxiety risk counseling today. She has risk factors for anxiety. We discussed the importance of a healthy work life  balance, a healthy relationship with food and a good support system.  Repetitive spaced learning was employed today to elicit superior memory formation and behavioral change.  4. Class 1 obesity with serious comorbidity and body mass index (BMI) of 30.0 to 30.9 in adult, unspecified obesity type Tamara Meza is currently in the action stage of change. As such, her goal is to continue with weight loss efforts. She has agreed to keeping a food journal and adhering to recommended goals of 1650-1800 calories and 125+ grams of protein daily.   Exercise goals: As is.  Behavioral modification strategies: increasing lean protein intake, increasing vegetables, meal planning and cooking strategies, keeping healthy foods in the home and planning for success.  Tamara Meza has agreed to follow-up with our clinic in 4 weeks. She was informed of the importance of frequent follow-up visits to maximize her success with intensive lifestyle modifications for her multiple health conditions.   Objective:   Blood pressure 127/82, pulse 74, temperature 97.6 F (36.4 C), temperature source Oral, height 5\' 8"  (1.727 m), weight 198 lb (89.8 kg), SpO2 95 %. Body mass index is 30.11 kg/m.  General: Cooperative, alert, well developed, in no acute distress. HEENT: Conjunctivae and lids unremarkable. Cardiovascular: Regular rhythm.  Lungs: Normal work of breathing. Neurologic: No focal deficits.   Lab Results  Component Value Date   CREATININE 0.70 02/18/2019   BUN 9 02/18/2019   NA 138 02/18/2019   K 4.2 02/18/2019   CL 103 02/18/2019   CO2 20 02/18/2019   Lab  Results  Component Value Date   ALT 23 02/18/2019   AST 24 02/18/2019   ALKPHOS 77 02/18/2019   BILITOT 0.4 02/18/2019   Lab Results  Component Value Date   HGBA1C 5.1 07/10/2019   HGBA1C 5.6 02/18/2019   Lab Results  Component Value Date   INSULIN 4.2 07/10/2019   INSULIN 10.7 02/18/2019   Lab Results  Component Value Date   TSH 3.540 02/18/2019    Lab Results  Component Value Date   CHOL 229 (H) 07/10/2019   HDL 65 07/10/2019   LDLCALC 151 (H) 07/10/2019   TRIG 74 07/10/2019   Lab Results  Component Value Date   WBC 8.9 02/18/2019   HGB 13.9 02/18/2019   HCT 42.1 02/18/2019   MCV 88 02/18/2019   PLT 266 02/18/2019   No results found for: IRON, TIBC, FERRITIN  Attestation Statements:   Reviewed by clinician on day of visit: allergies, medications, problem list, medical history, surgical history, family history, social history, and previous encounter notes.   I, Burt Knack, am acting as transcriptionist for Reuben Likes, MD.  I have reviewed the above documentation for accuracy and completeness, and I agree with the above. - Katherina Mires, MD

## 2019-12-01 ENCOUNTER — Ambulatory Visit (INDEPENDENT_AMBULATORY_CARE_PROVIDER_SITE_OTHER): Payer: BC Managed Care – PPO | Admitting: Family Medicine

## 2019-12-01 ENCOUNTER — Ambulatory Visit (INDEPENDENT_AMBULATORY_CARE_PROVIDER_SITE_OTHER): Payer: BC Managed Care – PPO | Admitting: Adult Health

## 2019-12-05 DIAGNOSIS — M9901 Segmental and somatic dysfunction of cervical region: Secondary | ICD-10-CM | POA: Diagnosis not present

## 2019-12-05 DIAGNOSIS — M542 Cervicalgia: Secondary | ICD-10-CM | POA: Diagnosis not present

## 2019-12-05 DIAGNOSIS — M9902 Segmental and somatic dysfunction of thoracic region: Secondary | ICD-10-CM | POA: Diagnosis not present

## 2019-12-05 DIAGNOSIS — M7912 Myalgia of auxiliary muscles, head and neck: Secondary | ICD-10-CM | POA: Diagnosis not present

## 2019-12-05 DIAGNOSIS — M7918 Myalgia, other site: Secondary | ICD-10-CM | POA: Diagnosis not present

## 2019-12-12 ENCOUNTER — Encounter (INDEPENDENT_AMBULATORY_CARE_PROVIDER_SITE_OTHER): Payer: Self-pay | Admitting: Family Medicine

## 2019-12-12 DIAGNOSIS — F908 Attention-deficit hyperactivity disorder, other type: Secondary | ICD-10-CM

## 2019-12-15 DIAGNOSIS — M9901 Segmental and somatic dysfunction of cervical region: Secondary | ICD-10-CM | POA: Diagnosis not present

## 2019-12-15 DIAGNOSIS — M9902 Segmental and somatic dysfunction of thoracic region: Secondary | ICD-10-CM | POA: Diagnosis not present

## 2019-12-15 DIAGNOSIS — M7918 Myalgia, other site: Secondary | ICD-10-CM | POA: Diagnosis not present

## 2019-12-15 DIAGNOSIS — M7912 Myalgia of auxiliary muscles, head and neck: Secondary | ICD-10-CM | POA: Diagnosis not present

## 2019-12-16 ENCOUNTER — Encounter (INDEPENDENT_AMBULATORY_CARE_PROVIDER_SITE_OTHER): Payer: Self-pay | Admitting: Family Medicine

## 2019-12-16 MED ORDER — VYVANSE 60 MG PO CHEW: 1.0000 | CHEWABLE_TABLET | Freq: Every day | ORAL | 0 refills | Status: DC

## 2019-12-16 NOTE — Addendum Note (Signed)
Addended by: Scarlett Presto on: 12/16/2019 09:31 AM   Modules accepted: Orders

## 2019-12-18 DIAGNOSIS — M9901 Segmental and somatic dysfunction of cervical region: Secondary | ICD-10-CM | POA: Diagnosis not present

## 2019-12-18 DIAGNOSIS — M9902 Segmental and somatic dysfunction of thoracic region: Secondary | ICD-10-CM | POA: Diagnosis not present

## 2019-12-18 DIAGNOSIS — M7912 Myalgia of auxiliary muscles, head and neck: Secondary | ICD-10-CM | POA: Diagnosis not present

## 2019-12-18 DIAGNOSIS — M7918 Myalgia, other site: Secondary | ICD-10-CM | POA: Diagnosis not present

## 2019-12-18 DIAGNOSIS — M542 Cervicalgia: Secondary | ICD-10-CM | POA: Diagnosis not present

## 2019-12-27 DIAGNOSIS — Z20828 Contact with and (suspected) exposure to other viral communicable diseases: Secondary | ICD-10-CM | POA: Diagnosis not present

## 2019-12-29 ENCOUNTER — Other Ambulatory Visit: Payer: Self-pay

## 2019-12-29 ENCOUNTER — Ambulatory Visit (INDEPENDENT_AMBULATORY_CARE_PROVIDER_SITE_OTHER): Payer: BC Managed Care – PPO | Admitting: Family Medicine

## 2019-12-29 ENCOUNTER — Encounter (INDEPENDENT_AMBULATORY_CARE_PROVIDER_SITE_OTHER): Payer: Self-pay | Admitting: Family Medicine

## 2019-12-29 VITALS — BP 103/68 | HR 59 | Temp 97.5°F | Ht 68.0 in | Wt 196.0 lb

## 2019-12-29 DIAGNOSIS — F32A Depression, unspecified: Secondary | ICD-10-CM

## 2019-12-29 DIAGNOSIS — F419 Anxiety disorder, unspecified: Secondary | ICD-10-CM

## 2019-12-29 DIAGNOSIS — F908 Attention-deficit hyperactivity disorder, other type: Secondary | ICD-10-CM | POA: Diagnosis not present

## 2019-12-29 DIAGNOSIS — Z6830 Body mass index (BMI) 30.0-30.9, adult: Secondary | ICD-10-CM

## 2019-12-29 DIAGNOSIS — F329 Major depressive disorder, single episode, unspecified: Secondary | ICD-10-CM

## 2019-12-29 DIAGNOSIS — E669 Obesity, unspecified: Secondary | ICD-10-CM

## 2019-12-29 MED ORDER — BUPROPION HCL ER (SR) 150 MG PO TB12: 150.0000 mg | ORAL_TABLET | Freq: Two times a day (BID) | ORAL | 0 refills | Status: DC

## 2019-12-29 MED ORDER — SERTRALINE HCL 50 MG PO TABS: 75.0000 mg | ORAL_TABLET | Freq: Every day | ORAL | 0 refills | Status: DC

## 2019-12-29 MED ORDER — VYVANSE 60 MG PO CHEW: 1.0000 | CHEWABLE_TABLET | Freq: Every day | ORAL | 0 refills | Status: DC

## 2019-12-30 NOTE — Progress Notes (Signed)
Chief Complaint:   OBESITY Tamara Meza is here to discuss her progress with her obesity treatment plan along with follow-up of her obesity related diagnoses. Tamara Meza is on the Category 2 Plan and states she is following her eating plan approximately 60% of the time. Tamara Meza states she is biking/walking/doing HIIT for 45 minutes 3-438 lbs times per week.  Today's visit was #: 15 Starting weight: 234 lbs Starting date: 02/18/2019 Today's weight: 196 lbs Today's date: 12/29/2019 Total lbs lost to date: 38 lbs Total lbs lost since last in-office visit: 2 lbs Total weight loss percentage to date: -16.24%  Interim History: Tamara Meza's BMI is 29.8!  She says that she is in a new relationship and very happy. She has been open with him about her weight loss journey and feels that he is very supportive (and he has also been working on [employer]). She continues to work on Sports administrator and is currently taking 75 mg daily. She is now seeing a Social worker in [place].   Assessment/Plan:   1. Attention deficit hyperactivity disorder (ADHD), BED Tamara Meza's symptoms of ADHD and BED are well-controlled on Vyvanse. Tamara Meza initially met binge eating disorder (BED) requirements, including: binging 4-13 times a week , eating until feeling uncomfortably full, eating large amounts of food when not feeling physically full and eating alone because of feeling embarrassed by how much you are eating, feeling disgusted.   People who binge eat feel as if they don't have control over how much they eat and have feelings of guilt or self-loathing after a binge eating episode. Tamara Meza estimates that about 30 percent of adults with binge eating disorder also have a history of ADHD. Binge eating and related obesity can underlie health problems like heart disease and diabetes. The FDA has approved Vyvanse as a treatment option for both ADHD and binge eating. Vyvanse targets the brain's reward center by increasing the levels of  dopamine and norepinephrine, the chemicals of the brain responsible for feelings of pleasure. Mindful eating is the recommended nutritional approach to treating BED.   - Refill Lisdexamfetamine Dimesylate (VYVANSE) 60 MG CHEW; Chew 1 tablet by mouth daily.  Dispense: 30 tablet; Refill: 0  Expectations, risks, and potential side effects reviewed. I have consulted the [place] Controlled Substances Registry for this patient, and feel the risk/benefit ratio today is favorable for proceeding with this prescription for a controlled substance. The patient understands monitoring parameters and red flags.   2. Anxiety and depression Improving. Tamara Meza is seeing a counselor in [place] every 2-3 weeks. She will continue to wean Zoloft. Her next dose plan is 50 mg daily x a few weeks. However, I will continue to prescribe 75 mg daily until she feels comfortable.  - Refill buPROPion (WELLBUTRIN SR) 150 MG 12 hr tablet; Take 1 tablet (150 mg total) by mouth 2 (two) times daily.  Dispense: 180 tablet; Refill: 0 - Refill sertraline (ZOLOFT) 50 MG tablet; Take 1.5 tablets (75 mg total) by mouth daily.  Dispense: 135 tablet; Refill: 0  3. Class 1 obesity with serious comorbidity and body mass index (BMI) of 30.0 to 30.9 in adult, unspecified obesity type Tamara Meza is currently in the action stage of change. As such, her goal is to continue with weight loss efforts. She has agreed to the Category 3 Plan.   Exercise goals: For substantial health benefits, adults should do at least 150 minutes (2 hours and 30 minutes) a week of moderate-intensity, or 75 minutes (1 hour and 15  minutes) a week of vigorous-intensity aerobic physical activity, or an equivalent combination of moderate- and vigorous-intensity aerobic activity. Aerobic activity should be performed in episodes of at least 10 minutes, and preferably, it should be spread throughout the week.  Behavioral modification strategies: increasing lean protein intake, decreasing  simple carbohydrates and dealing with family or coworker sabotage.  Tamara Meza has agreed to follow-up with our clinic in 4 weeks. She was informed of the importance of frequent follow-up visits to maximize her success with intensive lifestyle modifications for her multiple health conditions.   Objective:   Blood pressure 103/68, pulse (!) 59, temperature (!) 97.5 F (36.4 C), temperature source Oral, height 5' 8"  (1.727 m), weight 196 lb (88.9 kg), SpO2 100 %. Body mass index is 29.8 kg/m.  General: Cooperative, alert, well developed, in no acute distress. HEENT: Conjunctivae and lids unremarkable. Cardiovascular: Regular rhythm.  Lungs: Normal work of breathing. Neurologic: No focal deficits.   Lab Results  Component Value Date   CREATININE 0.70 02/18/2019   BUN 9 02/18/2019   NA 138 02/18/2019   K 4.2 02/18/2019   CL 103 02/18/2019   CO2 20 02/18/2019   Lab Results  Component Value Date   ALT 23 02/18/2019   AST 24 02/18/2019   ALKPHOS 77 02/18/2019   BILITOT 0.4 02/18/2019   Lab Results  Component Value Date   HGBA1C 5.1 07/10/2019   HGBA1C 5.6 02/18/2019   Lab Results  Component Value Date   INSULIN 4.2 07/10/2019   INSULIN 10.7 02/18/2019   Lab Results  Component Value Date   TSH 3.540 02/18/2019   Lab Results  Component Value Date   CHOL 229 (H) 07/10/2019   HDL 65 07/10/2019   LDLCALC 151 (H) 07/10/2019   TRIG 74 07/10/2019   Lab Results  Component Value Date   WBC 8.9 02/18/2019   HGB 13.9 02/18/2019   HCT 42.1 02/18/2019   MCV 88 02/18/2019   PLT 266 02/18/2019   Attestation Statements:   Reviewed by clinician on day of visit: allergies, medications, problem list, medical history, surgical history, family history, social history, and previous encounter notes.  I, Water quality scientist, CMA, am acting as transcriptionist for Briscoe Deutscher, DO  I have reviewed the above documentation for accuracy and completeness, and I agree with the above. Briscoe Deutscher, DO

## 2019-12-31 DIAGNOSIS — Z20828 Contact with and (suspected) exposure to other viral communicable diseases: Secondary | ICD-10-CM | POA: Diagnosis not present

## 2020-01-16 ENCOUNTER — Encounter (INDEPENDENT_AMBULATORY_CARE_PROVIDER_SITE_OTHER): Payer: Self-pay | Admitting: Family Medicine

## 2020-01-16 DIAGNOSIS — F908 Attention-deficit hyperactivity disorder, other type: Secondary | ICD-10-CM

## 2020-01-19 MED ORDER — VYVANSE 60 MG PO CHEW: 1.0000 | CHEWABLE_TABLET | Freq: Every day | ORAL | 0 refills | Status: DC

## 2020-01-26 ENCOUNTER — Encounter (INDEPENDENT_AMBULATORY_CARE_PROVIDER_SITE_OTHER): Payer: Self-pay | Admitting: Family Medicine

## 2020-01-26 ENCOUNTER — Other Ambulatory Visit: Payer: Self-pay

## 2020-01-26 ENCOUNTER — Ambulatory Visit (INDEPENDENT_AMBULATORY_CARE_PROVIDER_SITE_OTHER): Payer: BC Managed Care – PPO | Admitting: Family Medicine

## 2020-01-26 VITALS — BP 107/71 | HR 67 | Temp 97.8°F | Ht 68.0 in | Wt 195.0 lb

## 2020-01-26 DIAGNOSIS — F908 Attention-deficit hyperactivity disorder, other type: Secondary | ICD-10-CM

## 2020-01-26 DIAGNOSIS — F411 Generalized anxiety disorder: Secondary | ICD-10-CM

## 2020-01-26 DIAGNOSIS — E669 Obesity, unspecified: Secondary | ICD-10-CM | POA: Diagnosis not present

## 2020-01-26 DIAGNOSIS — Z6830 Body mass index (BMI) 30.0-30.9, adult: Secondary | ICD-10-CM | POA: Diagnosis not present

## 2020-01-26 DIAGNOSIS — F5081 Binge eating disorder: Secondary | ICD-10-CM | POA: Diagnosis not present

## 2020-01-27 NOTE — Progress Notes (Signed)
Chief Complaint:   OBESITY Tamara Meza is here to discuss her progress with her obesity treatment plan along with follow-up of her obesity related diagnoses. Tamara Meza is on the Category 3 Plan and states she is following her eating plan approximately 30-40% of the time. Tamara Meza states she is walking/cycling/HIIT 45 minutes 3-4 times per week.  Today's visit was #: 16 Starting weight: 234 lbs Starting date: 02/18/2019 Today's weight: 195 lbs Today's date: 01/26/2020 Total lbs lost to date: 39 lbs Total lbs lost since last in-office visit: 1 lb Total weight loss percentage to date: -16.67%  Interim History: Tamara Meza says she is doing well.  She is happy with maintenance.  Her relationship is great!  Assessment/Plan:   1. Binge eating disorder Tamara Meza is doing very well. Because she is in maintenance mode right now, and because she lives in [place], I will see her again in 3 months. Prescriptions provided today to cover that time period. She will call for a sooner appointment if any concerns or weight gain over 5 pounds.   People who binge eat feel as if they don't have control over how much they eat and have feelings of guilt or self-loathing after a binge eating episode. [employer] estimates that about 30 percent of adults with binge eating disorder also have a history of ADHD. The FDA has approved Vyvanse as a treatment option for both ADHD and binge eating. Vyvanse targets the brain's reward center by increasing the levels of dopamine and norepinephrine, the chemicals of the brain responsible for feelings of pleasure. Mindful eating is the recommended nutritional approach to treating BED.   Refill: - Lisdexamfetamine Dimesylate (VYVANSE) 60 MG CHEW; Chew 1 tablet by mouth daily.  Dispense: 30 tablet; Refill: 0 - Lisdexamfetamine Dimesylate (VYVANSE) 60 MG CHEW; Chew 60 mg by mouth daily.  Dispense: 30 tablet; Refill: 0 - Lisdexamfetamine Dimesylate (VYVANSE) 60 MG CHEW; Chew 60 mg by mouth  daily.  Dispense: 30 tablet; Refill: 0  I have consulted the [place] Controlled Substances Registry for this patient, and feel the risk/benefit ratio today is favorable for proceeding with this prescription for a controlled substance. The patient understands monitoring parameters and red flags.   2. GAD (generalized anxiety disorder) Tamara Meza is doing well.  Continue with therapist.  She is still weaning off Zoloft.  Behavior modification techniques were discussed today to help Tamara Meza deal with her anxiety.    3. Class 1 obesity with serious comorbidity and body mass index (BMI) of 30.0 to 30.9 in adult, unspecified obesity type  Tamara Meza is currently in the action stage of change. As such, her goal is to maintain weight for now. She has agreed to keeping a food journal and adhering to recommended goals of 1500 calories and 100 grams of protein.   Exercise goals: For substantial health benefits, adults should do at least 150 minutes (2 hours and 30 minutes) a week of moderate-intensity, or 75 minutes (1 hour and 15 minutes) a week of vigorous-intensity aerobic physical activity, or an equivalent combination of moderate- and vigorous-intensity aerobic activity. Aerobic activity should be performed in episodes of at least 10 minutes, and preferably, it should be spread throughout the week.  Behavioral modification strategies: increasing lean protein intake, decreasing simple carbohydrates, increasing vegetables and increasing water intake.  Tamara Meza has agreed to follow-up with our clinic in 3 weeks. She was informed of the importance of frequent follow-up visits to maximize her success with intensive lifestyle modifications for her multiple health conditions.  Objective:   Blood pressure 107/71, pulse 67, temperature 97.8 F (36.6 C), temperature source Oral, height 5\' 8"  (1.727 m), weight 195 lb (88.5 kg), SpO2 99 %. Body mass index is 29.65 kg/m.  General: Cooperative, alert, well developed, in no acute  distress. HEENT: Conjunctivae and lids unremarkable. Cardiovascular: Regular rhythm.  Lungs: Normal work of breathing. Neurologic: No focal deficits.   Lab Results  Component Value Date   CREATININE 0.70 02/18/2019   BUN 9 02/18/2019   NA 138 02/18/2019   K 4.2 02/18/2019   CL 103 02/18/2019   CO2 20 02/18/2019   Lab Results  Component Value Date   ALT 23 02/18/2019   AST 24 02/18/2019   ALKPHOS 77 02/18/2019   BILITOT 0.4 02/18/2019   Lab Results  Component Value Date   HGBA1C 5.1 07/10/2019   HGBA1C 5.6 02/18/2019   Lab Results  Component Value Date   INSULIN 4.2 07/10/2019   INSULIN 10.7 02/18/2019   Lab Results  Component Value Date   TSH 3.540 02/18/2019   Lab Results  Component Value Date   CHOL 229 (H) 07/10/2019   HDL 65 07/10/2019   LDLCALC 151 (H) 07/10/2019   TRIG 74 07/10/2019   Lab Results  Component Value Date   WBC 8.9 02/18/2019   HGB 13.9 02/18/2019   HCT 42.1 02/18/2019   MCV 88 02/18/2019   PLT 266 02/18/2019   Attestation Statements:   Reviewed by clinician on day of visit: allergies, medications, problem list, medical history, surgical history, family history, social history, and previous encounter notes.  I, 13/06/2018, CMA, am acting as transcriptionist for Insurance claims handler, DO  I have reviewed the above documentation for accuracy and completeness, and I agree with the above. Helane Rima, DO

## 2020-02-09 MED ORDER — VYVANSE 60 MG PO CHEW: 1.0000 | CHEWABLE_TABLET | Freq: Every day | ORAL | 0 refills | Status: DC

## 2020-02-09 MED ORDER — VYVANSE 60 MG PO CHEW: 60.0000 mg | CHEWABLE_TABLET | Freq: Every day | ORAL | 0 refills | Status: DC

## 2020-02-23 ENCOUNTER — Ambulatory Visit (INDEPENDENT_AMBULATORY_CARE_PROVIDER_SITE_OTHER): Payer: BC Managed Care – PPO | Admitting: Family Medicine

## 2020-03-22 ENCOUNTER — Encounter (INDEPENDENT_AMBULATORY_CARE_PROVIDER_SITE_OTHER): Payer: Self-pay | Admitting: Family Medicine

## 2020-04-26 ENCOUNTER — Encounter (INDEPENDENT_AMBULATORY_CARE_PROVIDER_SITE_OTHER): Payer: Self-pay | Admitting: Family Medicine

## 2020-04-26 ENCOUNTER — Ambulatory Visit (INDEPENDENT_AMBULATORY_CARE_PROVIDER_SITE_OTHER): Payer: BC Managed Care – PPO | Admitting: Family Medicine

## 2020-04-26 ENCOUNTER — Other Ambulatory Visit: Payer: Self-pay

## 2020-04-26 VITALS — BP 103/67 | HR 75 | Temp 97.5°F | Ht 68.0 in | Wt 206.0 lb

## 2020-04-26 DIAGNOSIS — E669 Obesity, unspecified: Secondary | ICD-10-CM | POA: Diagnosis not present

## 2020-04-26 DIAGNOSIS — Z9189 Other specified personal risk factors, not elsewhere classified: Secondary | ICD-10-CM

## 2020-04-26 DIAGNOSIS — F5081 Binge eating disorder: Secondary | ICD-10-CM | POA: Diagnosis not present

## 2020-04-26 DIAGNOSIS — F411 Generalized anxiety disorder: Secondary | ICD-10-CM | POA: Diagnosis not present

## 2020-04-26 DIAGNOSIS — F3289 Other specified depressive episodes: Secondary | ICD-10-CM | POA: Diagnosis not present

## 2020-04-26 DIAGNOSIS — Z6831 Body mass index (BMI) 31.0-31.9, adult: Secondary | ICD-10-CM

## 2020-04-26 MED ORDER — VYVANSE 60 MG PO CHEW: 1.0000 | CHEWABLE_TABLET | Freq: Every day | ORAL | 0 refills | Status: DC

## 2020-04-26 MED ORDER — VYVANSE 60 MG PO CHEW: 60.0000 mg | CHEWABLE_TABLET | Freq: Every day | ORAL | 0 refills | Status: DC

## 2020-04-28 NOTE — Progress Notes (Signed)
Chief Complaint:   OBESITY Tamara Meza is here to discuss her progress with her obesity treatment plan along with follow-up of her obesity related diagnoses.   Today's visit was #: 17 Starting weight: 234 lbs Starting date: 02/18/2019 Today's weight: 206 Today's date: 04/26/2020 Total lbs lost to date: 28 lbs Body mass index is 31.32 kg/m.  Total weight loss percentage to date: -11.97%  Interim History: Tamara Meza is here for her 3 month follow-up.  She says her boyfriend is still "an Chief Technology Officer".  They have had discussions of marriage/kids.  She says she did a lot of overindulging over the holidays.  She maintained her weight until last month, she says. Nutrition Plan: the Category 3 Plan for 10% of the time.  Activity: HIIT/strength training/walking for 45 minutes 4-5 times per week.  Assessment/Plan:   1. Binge eating disorder Controlled. The current medical regimen is effective;  continue present plan and medications.  - Refill Lisdexamfetamine Dimesylate (VYVANSE) 60 MG CHEW; Chew 1 tablet by mouth daily.  Dispense: 30 tablet; Refill: 0 - Lisdexamfetamine Dimesylate (VYVANSE) 60 MG CHEW; Chew 60 mg by mouth daily.  Dispense: 30 tablet; Refill: 0 - Lisdexamfetamine Dimesylate (VYVANSE) 60 MG CHEW; Chew 60 mg by mouth daily.  Dispense: 30 tablet; Refill: 0  Risk versus benefits of medication reviewed. The patient understands monitoring parameters and red flags. I have consulted the [place] Controlled Substances Registry for this patient, and feel the risk/benefit ratio today is favorable for proceeding with this prescription for a controlled substance. The patient understands monitoring parameters and red flags.   2. GAD (generalized anxiety disorder) Controlled.  Weaned Zoloft decreased to 50 mg daily. We will continue to monitor symptoms as they relate to her weight loss journey.  3. Other depression, with emotional eating Kody is taking Wellbutrin 150 mg daily.  Plan:  Continue  Wellbutrin.  4. At risk for anxiety Pearlene was given approximately 8 minutes of anxiety risk counseling today. She has risk factors for anxiety including decreasing Zoloft. We discussed the importance of a healthy work life balance, a healthy relationship with food and a good support system.  5. Class 1 obesity with serious comorbidity and body mass index (BMI) of 31.0 to 31.9 in adult, unspecified obesity type  Course: Tamara Meza is currently in the action stage of change. As such, her goal is to continue with weight loss efforts.   Nutrition goals: She has agreed to the Category 3 Plan.   Exercise goals: For substantial health benefits, adults should do at least 150 minutes (2 hours and 30 minutes) a week of moderate-intensity, or 75 minutes (1 hour and 15 minutes) a week of vigorous-intensity aerobic physical activity, or an equivalent combination of moderate- and vigorous-intensity aerobic activity. Aerobic activity should be performed in episodes of at least 10 minutes, and preferably, it should be spread throughout the week.  Behavioral modification strategies: increasing lean protein intake, decreasing simple carbohydrates, increasing vegetables, increasing water intake, holiday eating strategies  and celebration eating strategies.  Tamara Meza has agreed to follow-up with our clinic in 3 months. She was informed of the importance of frequent follow-up visits to maximize her success with intensive lifestyle modifications for her multiple health conditions.   Objective:   Blood pressure 103/67, pulse 75, temperature (!) 97.5 F (36.4 C), temperature source Oral, height 5\' 8"  (1.727 m), weight 206 lb (93.4 kg), SpO2 99 %. Body mass index is 31.32 kg/m.  General: Cooperative, alert, well developed, in no acute distress.  HEENT: Conjunctivae and lids unremarkable. Cardiovascular: Regular rhythm.  Lungs: Normal work of breathing. Neurologic: No focal deficits.   Lab Results  Component Value Date    CREATININE 0.70 02/18/2019   BUN 9 02/18/2019   NA 138 02/18/2019   K 4.2 02/18/2019   CL 103 02/18/2019   CO2 20 02/18/2019   Lab Results  Component Value Date   ALT 23 02/18/2019   AST 24 02/18/2019   ALKPHOS 77 02/18/2019   BILITOT 0.4 02/18/2019   Lab Results  Component Value Date   HGBA1C 5.1 07/10/2019   HGBA1C 5.6 02/18/2019   Lab Results  Component Value Date   INSULIN 4.2 07/10/2019   INSULIN 10.7 02/18/2019   Lab Results  Component Value Date   TSH 3.540 02/18/2019   Lab Results  Component Value Date   CHOL 229 (H) 07/10/2019   HDL 65 07/10/2019   LDLCALC 151 (H) 07/10/2019   TRIG 74 07/10/2019   Lab Results  Component Value Date   WBC 8.9 02/18/2019   HGB 13.9 02/18/2019   HCT 42.1 02/18/2019   MCV 88 02/18/2019   PLT 266 02/18/2019   Attestation Statements:   Reviewed by clinician on day of visit: allergies, medications, problem list, medical history, surgical history, family history, social history, and previous encounter notes.  Insurance claims handler acted as Energy manager.   I have reviewed the above documentation for accuracy and completeness, and I agree with the above. Helane Rima, DO

## 2020-05-30 ENCOUNTER — Other Ambulatory Visit (INDEPENDENT_AMBULATORY_CARE_PROVIDER_SITE_OTHER): Payer: Self-pay | Admitting: Family Medicine

## 2020-05-30 DIAGNOSIS — F32A Depression, unspecified: Secondary | ICD-10-CM

## 2020-05-30 DIAGNOSIS — F419 Anxiety disorder, unspecified: Secondary | ICD-10-CM

## 2020-05-31 NOTE — Telephone Encounter (Signed)
Dr.wallace °

## 2020-06-01 ENCOUNTER — Encounter (INDEPENDENT_AMBULATORY_CARE_PROVIDER_SITE_OTHER): Payer: Self-pay

## 2020-06-01 NOTE — Telephone Encounter (Signed)
Message sent to pt.

## 2020-06-28 ENCOUNTER — Encounter (INDEPENDENT_AMBULATORY_CARE_PROVIDER_SITE_OTHER): Payer: Self-pay | Admitting: Family Medicine

## 2020-06-28 DIAGNOSIS — F32A Depression, unspecified: Secondary | ICD-10-CM

## 2020-06-30 MED ORDER — BUPROPION HCL ER (SR) 150 MG PO TB12: 150.0000 mg | ORAL_TABLET | Freq: Two times a day (BID) | ORAL | 0 refills | Status: DC

## 2020-06-30 NOTE — Telephone Encounter (Signed)
Pt called in and stated that she sent a message 3/14 for a refill on her Wellbutrin. The pt said she call her pharmacy and they haven't received  have a request for it. I informed the pt I will send a message to the nurse. Please Advise

## 2020-07-20 ENCOUNTER — Other Ambulatory Visit: Payer: Self-pay

## 2020-07-20 ENCOUNTER — Ambulatory Visit (INDEPENDENT_AMBULATORY_CARE_PROVIDER_SITE_OTHER): Payer: BC Managed Care – PPO | Admitting: Family Medicine

## 2020-07-20 ENCOUNTER — Encounter (INDEPENDENT_AMBULATORY_CARE_PROVIDER_SITE_OTHER): Payer: Self-pay | Admitting: Family Medicine

## 2020-07-20 VITALS — BP 117/72 | HR 77 | Temp 97.8°F | Ht 68.0 in | Wt 202.0 lb

## 2020-07-20 DIAGNOSIS — F40298 Other specified phobia: Secondary | ICD-10-CM

## 2020-07-20 DIAGNOSIS — Z9189 Other specified personal risk factors, not elsewhere classified: Secondary | ICD-10-CM | POA: Diagnosis not present

## 2020-07-20 DIAGNOSIS — F5081 Binge eating disorder: Secondary | ICD-10-CM | POA: Diagnosis not present

## 2020-07-20 DIAGNOSIS — Z6835 Body mass index (BMI) 35.0-35.9, adult: Secondary | ICD-10-CM

## 2020-07-20 DIAGNOSIS — F419 Anxiety disorder, unspecified: Secondary | ICD-10-CM

## 2020-07-20 DIAGNOSIS — N926 Irregular menstruation, unspecified: Secondary | ICD-10-CM

## 2020-07-20 MED ORDER — VYVANSE 60 MG PO CHEW: 60.0000 mg | CHEWABLE_TABLET | Freq: Every day | ORAL | 0 refills | Status: DC

## 2020-07-20 MED ORDER — VYVANSE 60 MG PO CHEW: 1.0000 | CHEWABLE_TABLET | Freq: Every day | ORAL | 0 refills | Status: DC

## 2020-07-26 ENCOUNTER — Ambulatory Visit (INDEPENDENT_AMBULATORY_CARE_PROVIDER_SITE_OTHER): Payer: BC Managed Care – PPO | Admitting: Family Medicine

## 2020-07-26 NOTE — Progress Notes (Signed)
Chief Complaint:   OBESITY Tamara Meza is here to discuss her progress with her obesity treatment plan along with follow-up of her obesity related diagnoses.   Today's visit was #: 18 Starting weight: 234 lbs Starting date: 11/03/25020 Today's weight: 202 lbs Today's date: 07/20/2020 Total lbs lost to date: 32 lbs Body mass index is 30.71 kg/m.  Total weight loss percentage to date: -13.68%  Interim History:Tamara Meza says she is happy in her current relationship. She anticipates a fall/winter engagement. 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 She has done more eating out and drinking with her friends. She want to wean off Zoloft this summer. She also wishes she could be less focused on weight management.  Current Meal Plan: the Category 3 Plan.  Current Exercise Plan: HIIT/strength training for 45 minutes 4-5 times per week. Current Anti-Obesity Medications: Vyvanse 60 mg daily. Side effects: None.  Assessment/Plan:   1. Needle phobia Prohibits [place] from using [facility]. She will continue to consider this.  2. Irregular menses She has been instructed to continue monitoring and treating obesity. I also recommend that she find a local GYN now that she is living in [name].  3. Anxiety, with emotional eating Controlled.  Weaned Zoloft decreased to 50 mg daily. We will continue to monitor symptoms as they relate to her weight loss journey.  4. Binge eating disorder Controlled. The current medical regimen is effective;  continue present plan and medications.  - Refill Lisdexamfetamine Dimesylate (VYVANSE) 60 MG CHEW; Chew 1 tablet by mouth daily.  Dispense: 30 tablet; Refill: 0 - Refill Lisdexamfetamine Dimesylate (VYVANSE) 60 MG CHEW; Chew 60 mg by mouth daily.  Dispense: 30 tablet; Refill: 0  People who binge eat feel as if they don't have control over how much they eat and have feelings of guilt or self-loathing after a binge eating episode. Tamara Meza estimates  that about 30 percent of adults with binge eating disorder also have a history of ADHD. The FDA has approved Vyvanse as a treatment option for both ADHD and binge eating. Vyvanse targets the brain's reward center by increasing the levels of dopamine and norepinephrine, the chemicals of the brain responsible for feelings of pleasure. Mindful eating is the recommended nutritional approach to treating BED.   5. At risk for depression Tamara Meza was given approximately 8 minutes of depression prevention counseling today due to their higher than average risk for this condition. The patient has several risk factors for depression such as chronic medical conditions, sleep issues, major life stressors/events, etc., and we discussed these today.  Tamara Meza was also counseled on the importance of a healthy work-life balance, a healthy relationship with food, and a good support system.  We discussed various strategies to help cope with these emotions as well.  I recommended counseling, meditation or prayer, healthy eating habits, sleep hygiene, and exercising to help manage these feelings.   6. Obesity, current BMI 30.8  Course: Tamara Meza is currently in the action stage of change. As such, her goal is to continue with weight loss efforts.   Nutrition goals: She has agreed to keeping a food journal and adhering to recommended goals of 1500 calories and 100 calories of protein.   Exercise goals: For substantial health benefits, adults should do at least 150 minutes (2 hours and 30 minutes) a week of moderate-intensity, or 75 minutes (1 hour and 15 minutes) a week of vigorous-intensity aerobic physical activity, or an equivalent combination of  moderate- and vigorous-intensity aerobic activity. Aerobic activity should be performed in episodes of at least 10 minutes, and preferably, it should be spread throughout the week.  Behavioral modification strategies: increasing lean protein intake, decreasing  simple carbohydrates, increasing vegetables, increasing water intake, decreasing liquid calories, decreasing alcohol intake, decreasing sodium intake and increasing high fiber foods.  [name] has agreed to follow-up with our clinic in 3 months. She was informed of the importance of frequent follow-up visits to maximize her success with intensive lifestyle modifications for her multiple health conditions.   Objective:   Blood pressure 117/72, pulse 77, temperature 97.8 F (36.6 C), temperature source Oral, height 5\' 8"  (1.727 m), weight 202 lb (91.6 kg), SpO2 99 %. Body mass index is 30.71 kg/m.  General: Cooperative, alert, well developed, in no acute distress. HEENT: Conjunctivae and lids unremarkable. Cardiovascular: Regular rhythm.  Lungs: Normal work of breathing. Neurologic: No focal deficits.   Lab Results  Component Value Date   CREATININE 0.70 02/18/2019   BUN 9 02/18/2019   NA 138 02/18/2019   K 4.2 02/18/2019   CL 103 02/18/2019   CO2 20 02/18/2019   Lab Results  Component Value Date   ALT 23 02/18/2019   AST 24 02/18/2019   ALKPHOS 77 02/18/2019   BILITOT 0.4 02/18/2019   Lab Results  Component Value Date   HGBA1C 5.1 07/10/2019   HGBA1C 5.6 02/18/2019   Lab Results  Component Value Date   INSULIN 4.2 07/10/2019   INSULIN 10.7 02/18/2019   Lab Results  Component Value Date   TSH 3.540 02/18/2019   Lab Results  Component Value Date   CHOL 229 (H) 07/10/2019   HDL 65 07/10/2019   LDLCALC 151 (H) 07/10/2019   TRIG 74 07/10/2019   Lab Results  Component Value Date   WBC 8.9 02/18/2019   HGB 13.9 02/18/2019   HCT 42.1 02/18/2019   MCV 88 02/18/2019   PLT 266 02/18/2019   Attestation Statements:   Reviewed by clinician on day of visit: allergies, medications, problem list, medical history, surgical history, family history, social history, and previous encounter notes.  13/06/2018 [place], CMA, am acting as 09-[birth year]-1972 for Energy manager,  DO.  I have reviewed the above documentation for accuracy and completeness, and I agree with the above. [employer], DO

## 2020-08-02 ENCOUNTER — Telehealth (INDEPENDENT_AMBULATORY_CARE_PROVIDER_SITE_OTHER): Payer: Self-pay

## 2020-08-02 ENCOUNTER — Encounter (INDEPENDENT_AMBULATORY_CARE_PROVIDER_SITE_OTHER): Payer: Self-pay | Admitting: Family Medicine

## 2020-08-02 NOTE — Telephone Encounter (Signed)
Dr.Wallace °

## 2020-08-02 NOTE — Telephone Encounter (Signed)
Pt called in and stated that she needs refill on her Lisdexamfetamine. The pt stated that it says it cant be refilled till 4/22. I informed the pt that the quickest way to get a refill request is to send a message on [facility]. Please advise

## 2020-08-02 NOTE — Telephone Encounter (Signed)
Address through mychart message 

## 2020-08-03 DIAGNOSIS — R509 Fever, unspecified: Secondary | ICD-10-CM | POA: Diagnosis not present

## 2020-08-03 DIAGNOSIS — U071 COVID-19: Secondary | ICD-10-CM | POA: Diagnosis not present

## 2020-08-11 ENCOUNTER — Encounter (INDEPENDENT_AMBULATORY_CARE_PROVIDER_SITE_OTHER): Payer: Self-pay | Admitting: Family Medicine

## 2020-08-31 ENCOUNTER — Encounter (INDEPENDENT_AMBULATORY_CARE_PROVIDER_SITE_OTHER): Payer: Self-pay | Admitting: Family Medicine

## 2020-08-31 DIAGNOSIS — F32A Depression, unspecified: Secondary | ICD-10-CM

## 2020-08-31 DIAGNOSIS — F5081 Binge eating disorder: Secondary | ICD-10-CM

## 2020-08-31 DIAGNOSIS — F419 Anxiety disorder, unspecified: Secondary | ICD-10-CM

## 2020-08-31 MED ORDER — SERTRALINE HCL 50 MG PO TABS: 50.0000 mg | ORAL_TABLET | Freq: Every day | ORAL | 0 refills | Status: DC

## 2020-09-07 DIAGNOSIS — L03116 Cellulitis of left lower limb: Secondary | ICD-10-CM | POA: Diagnosis not present

## 2020-09-14 MED ORDER — VYVANSE 60 MG PO CHEW: 60.0000 mg | CHEWABLE_TABLET | Freq: Every day | ORAL | 0 refills | Status: DC

## 2020-09-14 NOTE — Addendum Note (Signed)
Addended by: Scarlett Presto on: 09/14/2020 10:51 AM   Modules accepted: Orders

## 2020-10-01 ENCOUNTER — Encounter (INDEPENDENT_AMBULATORY_CARE_PROVIDER_SITE_OTHER): Payer: Self-pay | Admitting: Family Medicine

## 2020-10-04 ENCOUNTER — Other Ambulatory Visit (INDEPENDENT_AMBULATORY_CARE_PROVIDER_SITE_OTHER): Payer: Self-pay

## 2020-10-04 MED ORDER — MOUNJARO 2.5 MG/0.5ML ~~LOC~~ SOAJ: 2.5000 mg | SUBCUTANEOUS | 0 refills | Status: DC

## 2020-10-19 ENCOUNTER — Other Ambulatory Visit (INDEPENDENT_AMBULATORY_CARE_PROVIDER_SITE_OTHER): Payer: Self-pay | Admitting: Family Medicine

## 2020-10-19 DIAGNOSIS — F5081 Binge eating disorder: Secondary | ICD-10-CM

## 2020-10-19 MED ORDER — VYVANSE 60 MG PO CHEW: 60.0000 mg | CHEWABLE_TABLET | Freq: Every day | ORAL | 0 refills | Status: DC

## 2020-10-19 NOTE — Telephone Encounter (Signed)
Pt last seen by Dr. Wallace.  

## 2020-10-25 ENCOUNTER — Encounter (INDEPENDENT_AMBULATORY_CARE_PROVIDER_SITE_OTHER): Payer: Self-pay | Admitting: Family Medicine

## 2020-10-25 ENCOUNTER — Other Ambulatory Visit: Payer: Self-pay

## 2020-10-25 ENCOUNTER — Ambulatory Visit (INDEPENDENT_AMBULATORY_CARE_PROVIDER_SITE_OTHER): Payer: BC Managed Care – PPO | Admitting: Family Medicine

## 2020-10-25 VITALS — BP 104/67 | HR 71 | Temp 97.8°F | Ht 68.0 in | Wt 202.0 lb

## 2020-10-25 DIAGNOSIS — F32A Depression, unspecified: Secondary | ICD-10-CM

## 2020-10-25 DIAGNOSIS — F5081 Binge eating disorder: Secondary | ICD-10-CM

## 2020-10-25 DIAGNOSIS — R7301 Impaired fasting glucose: Secondary | ICD-10-CM

## 2020-10-25 DIAGNOSIS — F419 Anxiety disorder, unspecified: Secondary | ICD-10-CM | POA: Diagnosis not present

## 2020-10-25 DIAGNOSIS — Z9189 Other specified personal risk factors, not elsewhere classified: Secondary | ICD-10-CM | POA: Diagnosis not present

## 2020-10-25 DIAGNOSIS — Z6835 Body mass index (BMI) 35.0-35.9, adult: Secondary | ICD-10-CM

## 2020-10-25 MED ORDER — TIRZEPATIDE 5 MG/0.5ML ~~LOC~~ SOAJ: 5.0000 mg | SUBCUTANEOUS | 0 refills | Status: AC

## 2020-10-25 MED ORDER — BUPROPION HCL ER (SR) 150 MG PO TB12: 150.0000 mg | ORAL_TABLET | Freq: Two times a day (BID) | ORAL | 0 refills | Status: DC

## 2020-10-25 MED ORDER — VYVANSE 60 MG PO CHEW: 60.0000 mg | CHEWABLE_TABLET | Freq: Every day | ORAL | 0 refills | Status: DC

## 2020-10-25 MED ORDER — SERTRALINE HCL 50 MG PO TABS: 75.0000 mg | ORAL_TABLET | Freq: Every day | ORAL | 0 refills | Status: DC

## 2020-10-25 NOTE — Progress Notes (Signed)
Chief Complaint:   OBESITY Tamara Meza is here to discuss her progress with her obesity treatment plan along with follow-up of her obesity related diagnoses. See Medical Weight Management Flowsheet for complete bioelectrical impedance results.  Today's visit was #: 19 Starting weight: 234 lbs Starting date: 02/18/2019 Today's weight: 202 lbs Today's date: 10/25/2020 Weight change since last visit: 0 Total lbs lost to date: 32 lbs Body mass index is 30.71 kg/m.  Total weight loss percentage to date: -13.68%  Interim History:  Tamara Meza is tolerating Mounjaro.  She endorses less polyphagia.  She was 207.8 lbs after her beach trip, so has been losing weight since coming home. Nutrition Plan: keeping a food journal and adhering to recommended goals of 1500 calories and 100 grams of protein 20% of the time. Activity:  HIIT and strength training for 40 minutes 3 times per week. Anti-obesity medications: Mounjaro 2.5 mg subcutaneously weekly. Reported side effects: None.  Assessment/Plan:   1. Impaired fasting glucose Will increase Monjaro to 5 mg subcutaneously weekly, as per below.  Refill sent to pharmacy.  - Increase and refill tirzepatide [place] Meza) 5 MG/0.5ML Pen; Inject 5 mg into the skin once a week.  Dispense: 2 mL; Refill: 0  2. Binge eating disorder Tamara Meza is taking Vyvanse 60 mg daily for BED.  Plan:  Will refill Vyvanse 60 mg today, as per below.  People who binge eat feel as if they don't have control over how much they eat and have feelings of guilt or self-loathing after a binge eating episode. Tamara Meza estimates that about 30 percent of adults with binge eating disorder also have a history of ADHD. The FDA has approved Vyvanse as a treatment option for both ADHD and binge eating. Vyvanse targets the brain's reward Meza by increasing the levels of dopamine and norepinephrine, the chemicals of the brain responsible for feelings of pleasure. Mindful eating is the  recommended nutritional approach to treating BED.   I have consulted the [place] Controlled Substances Registry for this patient, and feel the risk/benefit ratio today is favorable for proceeding with this prescription for a controlled substance. The patient understands monitoring parameters and red flags.   - Refill Lisdexamfetamine Dimesylate (VYVANSE) 60 MG CHEW; Chew 60 mg by mouth daily.  Dispense: 30 tablet; Refill: 0  3. Anxiety and depression Tamara Meza is taking Wellbutrin 150 mg twice daily and Zoloft 75 mg daily.    Plan:  Refill Wellbutrin and Zoloft at current doses today.  We will continue to monitor symptoms as they relate to her weight loss journey.  - Refill buPROPion (WELLBUTRIN SR) 150 MG 12 hr tablet; Take 1 tablet (150 mg total) by mouth 2 (two) times daily.  Dispense: 180 tablet; Refill: 0 - Refill sertraline (ZOLOFT) 50 MG tablet; Take 1.5 tablets (75 mg total) by mouth daily.  Dispense: 135 tablet; Refill: 0  4. At risk for constipation Tamara Meza is at increased risk for constipation due to inadequate water intake, changes in diet, and/or use of certain medications. Patient was provided with 9 minutes of counseling today regarding this condition and related ones.  We discussed preventative OTC therapies that exist such as MiraLAX, stool softeners, etc.   5. Obesity, current BMI 30.8  Course: Tamara Meza is currently in the action stage of change. As such, her goal is to continue with weight loss efforts.   Nutrition goals: She has agreed to practicing portion control and making smarter food choices, such as increasing vegetables and decreasing  simple carbohydrates.   Exercise goals: For substantial health benefits, adults should do at least 150 minutes (2 hours and 30 minutes) a week of moderate-intensity, or 75 minutes (1 hour and 15 minutes) a week of vigorous-intensity aerobic physical activity, or an equivalent combination of moderate- and vigorous-intensity aerobic  activity. Aerobic activity should be performed in episodes of at least 10 minutes, and preferably, it should be spread throughout the week.  Behavioral modification strategies: increasing lean protein intake, decreasing simple carbohydrates, increasing vegetables, and increasing water intake.  Tamara Meza has agreed to follow-up with our clinic in 2 months. She was informed of the importance of frequent follow-up visits to maximize her success with intensive lifestyle modifications for her multiple health conditions.   Objective:   Blood pressure 104/67, pulse 71, temperature 97.8 F (36.6 C), temperature source Oral, height 5\' 8"  (1.727 m), weight 202 lb (91.6 kg), SpO2 97 %. Body mass index is 30.71 kg/m.  General: Cooperative, alert, well developed, in no acute distress. HEENT: Conjunctivae and lids unremarkable. Cardiovascular: Regular rhythm.  Lungs: Normal work of breathing. Neurologic: No focal deficits.   Lab Results  Component Value Date   CREATININE 0.70 02/18/2019   BUN 9 02/18/2019   NA 138 02/18/2019   K 4.2 02/18/2019   CL 103 02/18/2019   CO2 20 02/18/2019   Lab Results  Component Value Date   ALT 23 02/18/2019   AST 24 02/18/2019   ALKPHOS 77 02/18/2019   BILITOT 0.4 02/18/2019   Lab Results  Component Value Date   HGBA1C 5.1 07/10/2019   HGBA1C 5.6 02/18/2019   Lab Results  Component Value Date   INSULIN 4.2 07/10/2019   INSULIN 10.7 02/18/2019   Lab Results  Component Value Date   TSH 3.540 02/18/2019   Lab Results  Component Value Date   CHOL 229 (H) 07/10/2019   HDL 65 07/10/2019   LDLCALC 151 (H) 07/10/2019   TRIG 74 07/10/2019   Lab Results  Component Value Date   VD25OH 42.3 07/10/2019   VD25OH 26.2 (L) 02/18/2019   Lab Results  Component Value Date   WBC 8.9 02/18/2019   HGB 13.9 02/18/2019   HCT 42.1 02/18/2019   MCV 88 02/18/2019   PLT 266 02/18/2019   Attestation Statements:   Reviewed by clinician on day of visit: allergies,  medications, problem list, medical history, surgical history, family history, social history, and previous encounter notes.  I, 13/06/2018, CMA, am acting as transcriptionist for Insurance claims handler, DO  I have reviewed the above documentation for accuracy and completeness, and I agree with the above. Helane Rima, DO

## 2020-10-27 ENCOUNTER — Encounter (INDEPENDENT_AMBULATORY_CARE_PROVIDER_SITE_OTHER): Payer: Self-pay | Admitting: Family Medicine

## 2020-10-27 DIAGNOSIS — J029 Acute pharyngitis, unspecified: Secondary | ICD-10-CM | POA: Diagnosis not present

## 2020-10-27 NOTE — Telephone Encounter (Signed)
Pt last seen by Dr. Wallace.  

## 2020-10-28 NOTE — Telephone Encounter (Signed)
PA has been started

## 2020-11-01 ENCOUNTER — Encounter (INDEPENDENT_AMBULATORY_CARE_PROVIDER_SITE_OTHER): Payer: Self-pay

## 2020-11-01 NOTE — Telephone Encounter (Signed)
Sent patient message to my-chart

## 2020-11-02 NOTE — Telephone Encounter (Signed)
Last OV with Dr Wallace 

## 2020-11-02 NOTE — Telephone Encounter (Signed)
Pt was calling in to check the status of the PA for the Sertraline. Pt would like a call back. Please Advise

## 2020-11-23 ENCOUNTER — Other Ambulatory Visit (INDEPENDENT_AMBULATORY_CARE_PROVIDER_SITE_OTHER): Payer: Self-pay | Admitting: Family Medicine

## 2020-11-23 DIAGNOSIS — R7301 Impaired fasting glucose: Secondary | ICD-10-CM

## 2020-11-25 ENCOUNTER — Encounter (INDEPENDENT_AMBULATORY_CARE_PROVIDER_SITE_OTHER): Payer: Self-pay | Admitting: Family Medicine

## 2020-11-25 DIAGNOSIS — F5081 Binge eating disorder: Secondary | ICD-10-CM

## 2020-11-29 MED ORDER — VYVANSE 60 MG PO CHEW: 60.0000 mg | CHEWABLE_TABLET | Freq: Every day | ORAL | 0 refills | Status: DC

## 2020-11-29 MED ORDER — TIRZEPATIDE 7.5 MG/0.5ML ~~LOC~~ SOAJ
7.5000 mg | SUBCUTANEOUS | 0 refills | Status: DC
Start: 2020-11-29 — End: 2020-12-23

## 2020-11-29 NOTE — Telephone Encounter (Signed)
Last OV with Dr Wallace 

## 2020-11-29 NOTE — Addendum Note (Signed)
Addended by: Scarlett Presto on: 11/29/2020 12:17 PM   Modules accepted: Orders

## 2020-12-23 ENCOUNTER — Encounter (INDEPENDENT_AMBULATORY_CARE_PROVIDER_SITE_OTHER): Payer: Self-pay | Admitting: Family Medicine

## 2020-12-23 ENCOUNTER — Ambulatory Visit (INDEPENDENT_AMBULATORY_CARE_PROVIDER_SITE_OTHER): Payer: BC Managed Care – PPO | Admitting: Family Medicine

## 2020-12-23 ENCOUNTER — Other Ambulatory Visit: Payer: Self-pay

## 2020-12-23 VITALS — BP 116/71 | HR 82 | Temp 97.3°F | Ht 68.0 in | Wt 190.0 lb

## 2020-12-23 DIAGNOSIS — F419 Anxiety disorder, unspecified: Secondary | ICD-10-CM

## 2020-12-23 DIAGNOSIS — Z9189 Other specified personal risk factors, not elsewhere classified: Secondary | ICD-10-CM

## 2020-12-23 DIAGNOSIS — Z6835 Body mass index (BMI) 35.0-35.9, adult: Secondary | ICD-10-CM

## 2020-12-23 DIAGNOSIS — F32A Depression, unspecified: Secondary | ICD-10-CM

## 2020-12-23 DIAGNOSIS — F5081 Binge eating disorder: Secondary | ICD-10-CM | POA: Diagnosis not present

## 2020-12-23 DIAGNOSIS — R7301 Impaired fasting glucose: Secondary | ICD-10-CM

## 2020-12-23 MED ORDER — TIRZEPATIDE 7.5 MG/0.5ML ~~LOC~~ SOAJ: 7.5000 mg | SUBCUTANEOUS | 0 refills | Status: DC

## 2020-12-23 MED ORDER — VYVANSE 60 MG PO CHEW: 60.0000 mg | CHEWABLE_TABLET | Freq: Every day | ORAL | 0 refills | Status: DC

## 2020-12-23 MED ORDER — SERTRALINE HCL 50 MG PO TABS: 75.0000 mg | ORAL_TABLET | Freq: Every day | ORAL | 0 refills | Status: DC

## 2020-12-28 NOTE — Progress Notes (Signed)
Chief Complaint:   OBESITY Tamara Meza is here to discuss her progress with her obesity treatment plan along with follow-up of her obesity related diagnoses.   Today's visit was #: 20 Starting weight: 234 lbs Starting date: 02/18/2019 Today's weight: 190 lbs Today's date: 12/23/2020 Weight change since last visit: 12 lbs Total lbs lost to date: 44 lbs Body mass index is 28.89 kg/m.  Total weight loss percentage to date: -18.80%  Current Meal Plan: the Category 3 Plan for 50% of the time.  Current Exercise Plan: Walking/HIIT for 45 minutes 3 times per week. Current Anti-Obesity Medications: Mounjaro 7.5 mg subcutaneously weekly. Side effects: None.  Interim History:  Tamara Meza is doing well.  She is happy with her progress.  Denies polyphagia.  Assessment/Plan:   1. Impaired fasting glucose, with polyphagia Controlled. Current treatment: Mounjaro 7.5 mg subcutaneously weekly. She will continue to focus on protein-rich, low simple carbohydrate foods. We reviewed the importance of hydration, regular exercise for stress reduction, and restorative sleep.  - Refill tirzepatide (MOUNJARO) 7.5 MG/0.5ML Pen; Inject 7.5 mg into the skin once a week.  Dispense: 2 mL; Refill: 0  2. Binge eating disorder Tamara Meza is taking Vyvanse 60 mg daily for BED.  Plan:  The current medical regimen is effective;  continue present plan and medications.  - Refill Lisdexamfetamine Dimesylate (VYVANSE) 60 MG CHEW; Chew 60 mg by mouth daily.  Dispense: 30 tablet; Refill: 0  I have consulted the [place] Controlled Substances Registry for this patient, and feel the risk/benefit ratio today is favorable for proceeding with this prescription for a controlled substance. The patient understands monitoring parameters and red flags.   3. Anxiety and depression Tamara Meza is taking Wellbutrin 150 mg twice daily and Zoloft 75 mg daily.     Plan:  Refill Zoloft at current dose today.  We will continue to monitor symptoms as they  relate to her weight loss journey.  - Refill sertraline (ZOLOFT) 50 MG tablet; Take 1.5 tablets (75 mg total) by mouth daily.  Dispense: 135 tablet; Refill: 0  4. At risk for deficient intake of food Tamara Meza was given extensive education and counseling today of more than 8 minutes on risks associated with deficient food intake.  Counseled her on the importance of following our prescribed meal plan and eating adequate amounts of protein.  Discussed with Tamara Meza that inadequate food intake over longer periods of time can slow their metabolism down significantly.    5. Obesity BMI today is 29.0  Course: Tamara Meza is currently in the action stage of change. As such, her goal is to continue with weight loss efforts.   Nutrition goals: She has agreed to the Category 3 Plan.   Exercise goals:  As is.  Behavioral modification strategies: increasing lean protein intake, decreasing simple carbohydrates, increasing vegetables, and increasing water intake.  Tamara Meza has agreed to follow-up with our clinic in 4 weeks. She was informed of the importance of frequent follow-up visits to maximize her success with intensive lifestyle modifications for her multiple health conditions.   Objective:   Blood pressure 116/71, pulse 82, temperature (!) 97.3 F (36.3 C), temperature source Oral, height 5\' 8"  (1.727 m), weight 190 lb (86.2 kg), SpO2 99 %. Body mass index is 28.89 kg/m.  General: Cooperative, alert, well developed, in no acute distress. HEENT: Conjunctivae and lids unremarkable. Cardiovascular: Regular rhythm.  Lungs: Normal work of breathing. Neurologic: No focal deficits.   Lab Results  Component Value Date   CREATININE 0.70  02/18/2019   BUN 9 02/18/2019   NA 138 02/18/2019   K 4.2 02/18/2019   CL 103 02/18/2019   CO2 20 02/18/2019   Lab Results  Component Value Date   ALT 23 02/18/2019   AST 24 02/18/2019   ALKPHOS 77 02/18/2019   BILITOT 0.4 02/18/2019   Lab Results  Component Value  Date   HGBA1C 5.1 07/10/2019   HGBA1C 5.6 02/18/2019   Lab Results  Component Value Date   INSULIN 4.2 07/10/2019   INSULIN 10.7 02/18/2019   Lab Results  Component Value Date   TSH 3.540 02/18/2019   Lab Results  Component Value Date   CHOL 229 (H) 07/10/2019   HDL 65 07/10/2019   LDLCALC 151 (H) 07/10/2019   TRIG 74 07/10/2019   Lab Results  Component Value Date   VD25OH 42.3 07/10/2019   VD25OH 26.2 (L) 02/18/2019   Lab Results  Component Value Date   WBC 8.9 02/18/2019   HGB 13.9 02/18/2019   HCT 42.1 02/18/2019   MCV 88 02/18/2019   PLT 266 02/18/2019   Attestation Statements:   Reviewed by clinician on day of visit: allergies, medications, problem list, medical history, surgical history, family history, social history, and previous encounter notes.  I, Insurance claims handler, CMA, am acting as transcriptionist for Helane Rima, DO  I have reviewed the above documentation for accuracy and completeness, and I agree with the above. Helane Rima, DO

## 2021-01-23 ENCOUNTER — Other Ambulatory Visit (INDEPENDENT_AMBULATORY_CARE_PROVIDER_SITE_OTHER): Payer: Self-pay | Admitting: Family Medicine

## 2021-01-23 DIAGNOSIS — F419 Anxiety disorder, unspecified: Secondary | ICD-10-CM

## 2021-01-23 DIAGNOSIS — R7301 Impaired fasting glucose: Secondary | ICD-10-CM

## 2021-01-23 DIAGNOSIS — F32A Depression, unspecified: Secondary | ICD-10-CM

## 2021-01-24 MED ORDER — SERTRALINE HCL 50 MG PO TABS: 75.0000 mg | ORAL_TABLET | Freq: Every day | ORAL | 0 refills | Status: DC

## 2021-01-24 MED ORDER — TIRZEPATIDE 7.5 MG/0.5ML ~~LOC~~ SOAJ: 7.5000 mg | SUBCUTANEOUS | 0 refills | Status: DC

## 2021-01-24 NOTE — Telephone Encounter (Signed)
LAST APPOINTMENT DATE: 12/23/20 NEXT APPOINTMENT DATE: 03/24/21   [facility] #[id number] [place], [place] - [address] AT [facility] OF [address] [place] [address] Phone: [phone] Fax: [phone]  [facility] #[id number] - [place], [place] - [phone] AT [facility] [place] [address] [place] [address] [place] [address] Phone: [phone] Fax: [phone]  Patient is requesting a refill of the following medications: Requested Prescriptions   Pending Prescriptions Disp Refills   sertraline (ZOLOFT) 50 MG tablet 135 tablet 0    Sig: Take 1.5 tablets (75 mg total) by mouth daily.   tirzepatide (MOUNJARO) 7.5 MG/0.5ML Pen 2 mL 0    Sig: Inject 7.5 mg into the skin once a week.    Date last filled: 12/23/20 Previously prescribed by Dr. Earlene Plater  Lab Results  Component Value Date   HGBA1C 5.1 07/10/2019   HGBA1C 5.6 02/18/2019   Lab Results  Component Value Date   LDLCALC 151 (H) 07/10/2019   CREATININE 0.70 02/18/2019   Lab Results  Component Value Date   VD25OH 42.3 07/10/2019   VD25OH 26.2 (L) 02/18/2019    BP Readings from Last 3 Encounters:  12/23/20 116/71  10/25/20 104/67  07/20/20 117/72

## 2021-01-24 NOTE — Telephone Encounter (Signed)
forwarding

## 2021-01-25 ENCOUNTER — Encounter (INDEPENDENT_AMBULATORY_CARE_PROVIDER_SITE_OTHER): Payer: Self-pay | Admitting: Family Medicine

## 2021-01-25 NOTE — Telephone Encounter (Signed)
Dr.Wallace °

## 2021-01-27 NOTE — Telephone Encounter (Signed)
Prior authorization was started for [facility] on 01/25/21. Will notify patient and provider once response is received.

## 2021-02-03 DIAGNOSIS — Z01419 Encounter for gynecological examination (general) (routine) without abnormal findings: Secondary | ICD-10-CM | POA: Diagnosis not present

## 2021-02-08 ENCOUNTER — Other Ambulatory Visit (INDEPENDENT_AMBULATORY_CARE_PROVIDER_SITE_OTHER): Payer: Self-pay | Admitting: Family Medicine

## 2021-02-08 DIAGNOSIS — F5081 Binge eating disorder: Secondary | ICD-10-CM

## 2021-02-08 NOTE — Telephone Encounter (Signed)
Last OV with Dr Wallace 

## 2021-02-09 MED ORDER — VYVANSE 60 MG PO CHEW: 60.0000 mg | CHEWABLE_TABLET | Freq: Every day | ORAL | 0 refills | Status: DC

## 2021-02-17 ENCOUNTER — Other Ambulatory Visit (INDEPENDENT_AMBULATORY_CARE_PROVIDER_SITE_OTHER): Payer: Self-pay | Admitting: Family Medicine

## 2021-02-17 DIAGNOSIS — R7301 Impaired fasting glucose: Secondary | ICD-10-CM

## 2021-02-17 MED ORDER — TIRZEPATIDE 7.5 MG/0.5ML ~~LOC~~ SOAJ: 7.5000 mg | SUBCUTANEOUS | 0 refills | Status: DC

## 2021-03-04 ENCOUNTER — Encounter (INDEPENDENT_AMBULATORY_CARE_PROVIDER_SITE_OTHER): Payer: Self-pay | Admitting: Family Medicine

## 2021-03-04 DIAGNOSIS — R7301 Impaired fasting glucose: Secondary | ICD-10-CM

## 2021-03-07 MED ORDER — WEGOVY 1.7 MG/0.75ML ~~LOC~~ SOAJ
1.7000 mg | SUBCUTANEOUS | 0 refills | Status: DC
Start: 2021-03-07 — End: 2021-08-09

## 2021-03-14 ENCOUNTER — Telehealth (INDEPENDENT_AMBULATORY_CARE_PROVIDER_SITE_OTHER): Payer: Self-pay | Admitting: Family Medicine

## 2021-03-14 ENCOUNTER — Encounter (INDEPENDENT_AMBULATORY_CARE_PROVIDER_SITE_OTHER): Payer: Self-pay

## 2021-03-14 NOTE — Telephone Encounter (Signed)
Prior authorization approved for Wegovy. Patient sent mychart message.  ?

## 2021-03-15 ENCOUNTER — Other Ambulatory Visit (INDEPENDENT_AMBULATORY_CARE_PROVIDER_SITE_OTHER): Payer: Self-pay | Admitting: Family Medicine

## 2021-03-15 DIAGNOSIS — F5081 Binge eating disorder: Secondary | ICD-10-CM

## 2021-03-15 NOTE — Telephone Encounter (Signed)
Pt last seen by Dr. Wallace.  

## 2021-03-17 ENCOUNTER — Other Ambulatory Visit: Payer: Self-pay

## 2021-03-17 ENCOUNTER — Telehealth (INDEPENDENT_AMBULATORY_CARE_PROVIDER_SITE_OTHER): Payer: BC Managed Care – PPO | Admitting: Family Medicine

## 2021-03-17 DIAGNOSIS — F5081 Binge eating disorder: Secondary | ICD-10-CM | POA: Diagnosis not present

## 2021-03-17 DIAGNOSIS — Z6835 Body mass index (BMI) 35.0-35.9, adult: Secondary | ICD-10-CM | POA: Diagnosis not present

## 2021-03-17 DIAGNOSIS — R112 Nausea with vomiting, unspecified: Secondary | ICD-10-CM | POA: Diagnosis not present

## 2021-03-17 DIAGNOSIS — T887XXA Unspecified adverse effect of drug or medicament, initial encounter: Secondary | ICD-10-CM | POA: Diagnosis not present

## 2021-03-17 MED ORDER — SCOPOLAMINE 1 MG/3DAYS TD PT72: 1.0000 | MEDICATED_PATCH | TRANSDERMAL | 12 refills | Status: AC

## 2021-03-20 MED ORDER — VYVANSE 60 MG PO CHEW
60.0000 mg | CHEWABLE_TABLET | Freq: Every day | ORAL | 0 refills | Status: DC
Start: 2021-03-20 — End: 2021-03-24

## 2021-03-21 ENCOUNTER — Encounter (INDEPENDENT_AMBULATORY_CARE_PROVIDER_SITE_OTHER): Payer: Self-pay | Admitting: Family Medicine

## 2021-03-22 NOTE — Progress Notes (Signed)
TeleHealth Visit:  Due to the COVID-19 pandemic, this visit was completed with telemedicine (audio/video) technology to reduce patient and provider exposure as well as to preserve personal protective equipment.   [name] has verbally consented to this TeleHealth visit. The patient is located at home, the provider is located at the [facility] office. The participants in this visit include the listed provider and patient. The visit was conducted today via MyChart video.  Chief Complaint: OBESITY Tamara Meza is here to discuss her progress with her obesity treatment plan along with follow-up of her obesity related diagnoses. Tamara Meza is on the Category 3 Plan. Tamara Meza states she is not exercising regularly.  Today's visit was #: 21 Starting weight: 234 lbs Starting date: 02/18/2019  Interim History: Tamara Meza took [facility] 1.7 mg at 1 pm and had to take Zofran every 8 hours at 1 am and 9:00 am.  She transitioned to [facility] and is not tolerating it.  Assessment/Plan:   1. Binge eating disorder Ramyah is taking Vyvanse 60 mg daily for BED.  Plan:  Continue Vyvanse 60 mg daily.  Will refill today.  The goals for treatment of BED are to reduce eating binges and to achieve healthy eating habits. Because binge eating can correlate with negative emotions, treatment may also address any other mental-health issues, such as depression.  People who binge eat feel as if they don't have control over how much they eat and have feelings of guilt or self-loathing after a binge eating episode.  The FDA has approved Vyvanse as a treatment option for binge eating disorder. Vyvanse targets the brain's reward center by increasing the levels of dopamine and norepinephrine, the chemicals of the brain responsible for feelings of pleasure.  Mindful eating is the recommended nutritional approach to treating BED.   - Refill Lisdexamfetamine Dimesylate (VYVANSE) 60 MG CHEW; Chew 60 mg by mouth daily.  Dispense: 30  tablet; Refill: 0  I have consulted the [place] Controlled Substances Registry for this patient, and feel the risk/benefit ratio today is favorable for proceeding with this prescription for a controlled substance. The patient understands monitoring parameters and red flags.   2. Nausea and vomiting in adult Start scopolamine patch and OTC Pepcid AC.  3. Class 2 severe obesity with serious comorbidity and body mass index (BMI) of 35.0 to 35.9 in adult, unspecified obesity type [facility])  Tamara Meza is currently in the action stage of change. As such, her goal is to continue with weight loss efforts. She has agreed to practicing portion control and making smarter food choices, such as increasing vegetables and decreasing simple carbohydrates. Bowel rest, increase hydration during the next few days.  Exercise goals:  As is.  Behavioral modification strategies: increasing water intake.  Tamara Meza has agreed to follow-up with our clinic in 4 weeks. She was informed of the importance of frequent follow-up visits to maximize her success with intensive lifestyle modifications for her multiple health conditions.  Objective:   VITALS: Per patient if applicable, see vitals. GENERAL: Alert and in no acute distress. CARDIOPULMONARY: No increased WOB. Speaking in clear sentences.  PSYCH: Pleasant and cooperative. Speech normal rate and rhythm. Affect is appropriate. Insight and judgement are appropriate. Attention is focused, linear, and appropriate.  NEURO: Oriented as arrived to appointment on time with no prompting.   Lab Results  Component Value Date   CREATININE 0.70 02/18/2019   BUN 9 02/18/2019   NA 138 02/18/2019   K 4.2 02/18/2019   CL 103 02/18/2019  CO2 20 02/18/2019   Lab Results  Component Value Date   ALT 23 02/18/2019   AST 24 02/18/2019   ALKPHOS 77 02/18/2019   BILITOT 0.4 02/18/2019   Lab Results  Component Value Date   HGBA1C 5.1 07/10/2019   HGBA1C 5.6 02/18/2019   Lab Results   Component Value Date   INSULIN 4.2 07/10/2019   INSULIN 10.7 02/18/2019   Lab Results  Component Value Date   TSH 3.540 02/18/2019   Lab Results  Component Value Date   CHOL 229 (H) 07/10/2019   HDL 65 07/10/2019   LDLCALC 151 (H) 07/10/2019   TRIG 74 07/10/2019   Lab Results  Component Value Date   VD25OH 42.3 07/10/2019   VD25OH 26.2 (L) 02/18/2019   Lab Results  Component Value Date   WBC 8.9 02/18/2019   HGB 13.9 02/18/2019   HCT 42.1 02/18/2019   MCV 88 02/18/2019   PLT 266 02/18/2019   Attestation Statements:   Reviewed by clinician on day of visit: allergies, medications, problem list, medical history, surgical history, family history, social history, and previous encounter notes.  I, Insurance claims handler, CMA, am acting as transcriptionist for Helane Rima, DO  I have reviewed the above documentation for accuracy and completeness, and I agree with the above. - Helane Rima, DO, MS, FAAFP, DABOM - Family and Bariatric Medicine.

## 2021-03-24 ENCOUNTER — Ambulatory Visit (INDEPENDENT_AMBULATORY_CARE_PROVIDER_SITE_OTHER): Payer: BC Managed Care – PPO | Admitting: Family Medicine

## 2021-03-24 ENCOUNTER — Other Ambulatory Visit: Payer: Self-pay

## 2021-03-24 ENCOUNTER — Encounter (INDEPENDENT_AMBULATORY_CARE_PROVIDER_SITE_OTHER): Payer: Self-pay | Admitting: Family Medicine

## 2021-03-24 VITALS — BP 106/70 | HR 85 | Temp 97.5°F | Ht 68.0 in | Wt 182.0 lb

## 2021-03-24 DIAGNOSIS — F411 Generalized anxiety disorder: Secondary | ICD-10-CM

## 2021-03-24 DIAGNOSIS — B009 Herpesviral infection, unspecified: Secondary | ICD-10-CM

## 2021-03-24 DIAGNOSIS — Z6835 Body mass index (BMI) 35.0-35.9, adult: Secondary | ICD-10-CM

## 2021-03-24 DIAGNOSIS — F5081 Binge eating disorder: Secondary | ICD-10-CM

## 2021-03-24 MED ORDER — VALACYCLOVIR HCL 500 MG PO TABS: 500.0000 mg | ORAL_TABLET | Freq: Every day | ORAL | 3 refills | Status: DC

## 2021-03-24 MED ORDER — VYVANSE 60 MG PO CHEW: 60.0000 mg | CHEWABLE_TABLET | Freq: Every day | ORAL | 0 refills | Status: DC

## 2021-03-24 MED ORDER — VYVANSE 60 MG PO CHEW
60.0000 mg | CHEWABLE_TABLET | Freq: Every day | ORAL | 0 refills | Status: DC
Start: 2021-03-24 — End: 2021-06-22

## 2021-03-24 MED ORDER — SERTRALINE HCL 50 MG PO TABS: 75.0000 mg | ORAL_TABLET | Freq: Every day | ORAL | 3 refills | Status: DC

## 2021-03-28 NOTE — Progress Notes (Signed)
Chief Complaint:   OBESITY Tamara Meza is here to discuss her progress with her obesity treatment plan along with follow-up of her obesity related diagnoses. See Medical Weight Management Flowsheet for complete bioelectrical impedance results.  Today's visit was #: 22 Starting weight: 234 lbs Starting date: 02/18/2019 Weight change since last visit: 8 lbs Total lbs lost to date: 52 lbs Total weight loss percentage to date: -22.22%  Nutrition Plan: Category 3 Plan for 20% of the time.  Activity: HIIT/walking/strength training for 30-40 minutes 4 times per week. Anti-obesity medications: Wegovy 1.7 mg subcutaneously weekly. Reported side effects: None.  Interim History: Tamara Meza says she is feeling much better.  She is engaged and the wedding is on May 27.  Assessment/Plan:   1. HSV infection Will refill Valtrex 500 mg daily for 1 year.  - Refill valACYclovir (VALTREX) 500 MG tablet; Take 1 tablet (500 mg total) by mouth daily.  Dispense: 90 tablet; Refill: 3  2. Binge eating disorder Tamara Meza is taking Vyvanse 60 mg daily for BED.   Plan:  Continue Vyvanse 60 mg daily.  Will refill for 3 months today.  - Refill Lisdexamfetamine Dimesylate (VYVANSE) 60 MG CHEW; Chew 60 mg by mouth daily.  Dispense: 30 tablet; Refill: 0 - Lisdexamfetamine Dimesylate (VYVANSE) 60 MG CHEW; Chew 60 mg by mouth daily at 12 noon.  Dispense: 30 tablet; Refill: 0 - Lisdexamfetamine Dimesylate (VYVANSE) 60 MG CHEW; Chew 60 mg by mouth daily at 12 noon.  Dispense: 30 tablet; Refill: 0  I have consulted the [place] Controlled Substances Registry for this patient, and feel the risk/benefit ratio today is favorable for proceeding with this prescription for a controlled substance. The patient understands monitoring parameters and red flags.   3. GAD (generalized anxiety disorder) Tamara Meza is taking Zoloft 75 mg daily for anxiety.  Plan:  Continue Zoloft at current dose.  Will refill for 1 year today.  - Refill sertraline  (ZOLOFT) 50 MG tablet; Take 1.5 tablets (75 mg total) by mouth daily.  Dispense: 135 tablet; Refill: 3  4. Obesity, current BMI 27.7  Course: Tamara Meza is currently in the action stage of change. As such, her goal is to continue with weight loss efforts.   Nutrition goals: She has agreed to keeping a food journal and adhering to recommended goals of 1500-2000 calories and 125 grams of protein.   Exercise goals:  As is.  Behavioral modification strategies: increasing lean protein intake, decreasing simple carbohydrates, increasing vegetables, and increasing water intake.  Tamara Meza has agreed to follow-up with our clinic in 3 months. She was informed of the importance of frequent follow-up visits to maximize her success with intensive lifestyle modifications for her multiple health conditions.   Objective:   Blood pressure 106/70, pulse 85, temperature (!) 97.5 F (36.4 C), temperature source Oral, height 5\' 8"  (1.727 m), weight 182 lb (82.6 kg), SpO2 100 %. Body mass index is 27.67 kg/m.  General: Cooperative, alert, well developed, in no acute distress. HEENT: Conjunctivae and lids unremarkable. Cardiovascular: Regular rhythm.  Lungs: Normal work of breathing. Neurologic: No focal deficits.   Lab Results  Component Value Date   CREATININE 0.70 02/18/2019   BUN 9 02/18/2019   NA 138 02/18/2019   K 4.2 02/18/2019   CL 103 02/18/2019   CO2 20 02/18/2019   Lab Results  Component Value Date   ALT 23 02/18/2019   AST 24 02/18/2019   ALKPHOS 77 02/18/2019   BILITOT 0.4 02/18/2019   Lab Results  Component Value Date   HGBA1C 5.1 07/10/2019   HGBA1C 5.6 02/18/2019   Lab Results  Component Value Date   INSULIN 4.2 07/10/2019   INSULIN 10.7 02/18/2019   Lab Results  Component Value Date   TSH 3.540 02/18/2019   Lab Results  Component Value Date   CHOL 229 (H) 07/10/2019   HDL 65 07/10/2019   LDLCALC 151 (H) 07/10/2019   TRIG 74 07/10/2019   Lab Results  Component Value  Date   VD25OH 42.3 07/10/2019   VD25OH 26.2 (L) 02/18/2019   Lab Results  Component Value Date   WBC 8.9 02/18/2019   HGB 13.9 02/18/2019   HCT 42.1 02/18/2019   MCV 88 02/18/2019   PLT 266 02/18/2019   Attestation Statements:   Reviewed by clinician on day of visit: allergies, medications, problem list, medical history, surgical history, family history, social history, and previous encounter notes.  I, Insurance claims handler, CMA, am acting as transcriptionist for Helane Rima, DO  I have reviewed the above documentation for accuracy and completeness, and I agree with the above. -  Helane Rima, DO, MS, FAAFP, DABOM - Family and Bariatric Medicine.

## 2021-05-03 ENCOUNTER — Other Ambulatory Visit (INDEPENDENT_AMBULATORY_CARE_PROVIDER_SITE_OTHER): Payer: Self-pay | Admitting: Family Medicine

## 2021-05-03 DIAGNOSIS — F5081 Binge eating disorder: Secondary | ICD-10-CM

## 2021-05-03 NOTE — Telephone Encounter (Signed)
Dr.Wallace °

## 2021-06-22 ENCOUNTER — Other Ambulatory Visit (INDEPENDENT_AMBULATORY_CARE_PROVIDER_SITE_OTHER): Payer: Self-pay | Admitting: Family Medicine

## 2021-06-22 DIAGNOSIS — F5081 Binge eating disorder: Secondary | ICD-10-CM

## 2021-06-22 NOTE — Telephone Encounter (Signed)
Dr.Wallace °

## 2021-06-23 ENCOUNTER — Ambulatory Visit (INDEPENDENT_AMBULATORY_CARE_PROVIDER_SITE_OTHER): Payer: BC Managed Care – PPO | Admitting: Family Medicine

## 2021-06-25 MED ORDER — VYVANSE 60 MG PO CHEW: 60.0000 mg | CHEWABLE_TABLET | Freq: Every day | ORAL | 0 refills | Status: DC

## 2021-07-25 ENCOUNTER — Encounter (INDEPENDENT_AMBULATORY_CARE_PROVIDER_SITE_OTHER): Payer: Self-pay | Admitting: Family Medicine

## 2021-07-25 DIAGNOSIS — F411 Generalized anxiety disorder: Secondary | ICD-10-CM

## 2021-07-25 MED ORDER — SERTRALINE HCL 50 MG PO TABS: 75.0000 mg | ORAL_TABLET | Freq: Every day | ORAL | 3 refills | Status: AC

## 2021-07-25 NOTE — Telephone Encounter (Signed)
Last OV with Dr Wallace 

## 2021-08-08 ENCOUNTER — Encounter (INDEPENDENT_AMBULATORY_CARE_PROVIDER_SITE_OTHER): Payer: Self-pay | Admitting: Family Medicine

## 2021-08-09 ENCOUNTER — Encounter (INDEPENDENT_AMBULATORY_CARE_PROVIDER_SITE_OTHER): Payer: Self-pay | Admitting: Family Medicine

## 2021-08-09 ENCOUNTER — Telehealth (INDEPENDENT_AMBULATORY_CARE_PROVIDER_SITE_OTHER): Payer: BC Managed Care – PPO | Admitting: Family Medicine

## 2021-08-09 DIAGNOSIS — Z6827 Body mass index (BMI) 27.0-27.9, adult: Secondary | ICD-10-CM

## 2021-08-09 DIAGNOSIS — E669 Obesity, unspecified: Secondary | ICD-10-CM | POA: Diagnosis not present

## 2021-08-09 DIAGNOSIS — B009 Herpesviral infection, unspecified: Secondary | ICD-10-CM | POA: Diagnosis not present

## 2021-08-09 DIAGNOSIS — F5081 Binge eating disorder: Secondary | ICD-10-CM

## 2021-08-09 MED ORDER — VALACYCLOVIR HCL 500 MG PO TABS: 500.0000 mg | ORAL_TABLET | Freq: Two times a day (BID) | ORAL | 0 refills | Status: AC

## 2021-08-09 NOTE — Progress Notes (Signed)
?TeleHealth Visit:  ?This visit was completed with telemedicine (audio/video) technology. ?Tamara Meza has verbally consented to this TeleHealth visit. The patient is located at home, the provider is located at home. The participants in this visit include the listed provider and patient. The visit was conducted today via MyChart video. ? ?OBESITY ?[name] is here to discuss her progress with her obesity treatment plan along with follow-up of her obesity related diagnoses.  ? ?Today's visit was # 23 ?Starting weight: 234 lbs ?Starting date: 02/18/2019 ?Weight at last in office visit: 182 lbs on 03/24/21 ?Total weight loss: 52 lbs at last in office visit on 03/24/21. ?Today's reported weight: 200 lbs  ? ?Nutrition Plan: the Category 3 Plan. Not following it. ?Hunger is moderately controlled. Cravings are moderately controlled.  ? ?Interim History: Tamara Meza has not had an office visit since March 24, 2021.  This is mostly because she has moved to [place].   ?Her reported weight of 200 pounds is an increase of 18 pounds since her December visit. ?She has been taking Vyvanse which she feels keeps her from being preoccupied with food all day long. ?She is getting married in 1 month and has already had her final dress fitting. ?She is not currently on a GLP-1 because she may be trying to get pregnant soon after her wedding.  ?She reports she would like to maintain a weight of about 195 pounds. ? ?Assessment/Plan:  ?1. Binge eating disorder ?Avaiah has been prescribed Vyvanse for binge eating disorder.  She feels this is very helpful for her appetite. ? ?Plan: ?Since she has not had an in office visit since December we will not be able to refill Vyvanse at this time.  I advised that we may be able to consider other options such as phentermine. ? ?2.  HSV infection ?Sharry occasionally gets ulcers in her mouth and has been prescribed Valtrex for this.  She is in need of a refill. ? ?Plan: ?Refill Valtrex 500 mg twice daily   ?She will take this for about 5 days when she feels an ulcer developing. ? ?Obesity: Current BMI 27.7 ?Tamara Meza is currently in the action stage of change. As such, her goal is to continue with weight loss efforts.  ?She has agreed to the Category 3 Plan or journaling 15 00 to 1700 cal/day and 100 g of protein per day. ? ?Exercise goals: No exercise has been prescribed at this time. ? ?Behavioral modification strategies: increasing lean protein intake and decreasing simple carbohydrates. ? ?Tamara Meza has agreed to follow-up with our clinic in 6 weeks.  ? ?No orders of the defined types were placed in this encounter. ? ? ?There are no discontinued medications.  ? ?No orders of the defined types were placed in this encounter. ?   ? ?Objective:  ? ?VITALS: Per patient if applicable, see vitals. ?GENERAL: Alert and in no acute distress. ?CARDIOPULMONARY: No increased WOB. Speaking in clear sentences.  ?PSYCH: Pleasant and cooperative. Speech normal rate and rhythm. Affect is appropriate. Insight and judgement are appropriate. Attention is focused, linear, and appropriate.  ?NEURO: Oriented as arrived to appointment on time with no prompting.  ? ?Lab Results  ?Component Value Date  ? CREATININE 0.70 02/18/2019  ? BUN 9 02/18/2019  ? NA 138 02/18/2019  ? K 4.2 02/18/2019  ? CL 103 02/18/2019  ? CO2 20 02/18/2019  ? ?Lab Results  ?Component Value Date  ? ALT 23 02/18/2019  ? AST 24 02/18/2019  ? ALKPHOS 77 02/18/2019  ?  BILITOT 0.4 02/18/2019  ? ?Lab Results  ?Component Value Date  ? HGBA1C 5.1 07/10/2019  ? HGBA1C 5.6 02/18/2019  ? ?Lab Results  ?Component Value Date  ? INSULIN 4.2 07/10/2019  ? INSULIN 10.7 02/18/2019  ? ?Lab Results  ?Component Value Date  ? TSH 3.540 02/18/2019  ? ?Lab Results  ?Component Value Date  ? CHOL 229 (H) 07/10/2019  ? HDL 65 07/10/2019  ? LDLCALC 151 (H) 07/10/2019  ? TRIG 74 07/10/2019  ? ?Lab Results  ?Component Value Date  ? WBC 8.9 02/18/2019  ? HGB 13.9 02/18/2019  ? HCT 42.1 02/18/2019  ?  MCV 88 02/18/2019  ? PLT 266 02/18/2019  ? ?No results found for: IRON, TIBC, FERRITIN ?Lab Results  ?Component Value Date  ? VD25OH 42.3 07/10/2019  ? VD25OH 26.2 (L) 02/18/2019  ? ? ?Attestation Statements:  ? ?Reviewed by clinician on day of visit: allergies, medications, problem list, medical history, surgical history, family history, social history, and previous encounter notes. ? ? ?

## 2021-08-15 ENCOUNTER — Ambulatory Visit (INDEPENDENT_AMBULATORY_CARE_PROVIDER_SITE_OTHER): Payer: BC Managed Care – PPO | Admitting: Family Medicine

## 2021-08-15 ENCOUNTER — Encounter (INDEPENDENT_AMBULATORY_CARE_PROVIDER_SITE_OTHER): Payer: Self-pay

## 2021-08-15 DIAGNOSIS — F411 Generalized anxiety disorder: Secondary | ICD-10-CM | POA: Diagnosis not present

## 2021-08-15 DIAGNOSIS — F5081 Binge eating disorder: Secondary | ICD-10-CM | POA: Diagnosis not present

## 2021-08-15 DIAGNOSIS — Z6829 Body mass index (BMI) 29.0-29.9, adult: Secondary | ICD-10-CM | POA: Diagnosis not present

## 2021-09-08 DIAGNOSIS — Z0000 Encounter for general adult medical examination without abnormal findings: Secondary | ICD-10-CM | POA: Diagnosis not present

## 2021-09-20 ENCOUNTER — Ambulatory Visit (INDEPENDENT_AMBULATORY_CARE_PROVIDER_SITE_OTHER): Payer: BC Managed Care – PPO | Admitting: Bariatrics

## 2021-11-23 ENCOUNTER — Encounter (INDEPENDENT_AMBULATORY_CARE_PROVIDER_SITE_OTHER): Payer: Self-pay

## 2022-01-15 DIAGNOSIS — R5081 Fever presenting with conditions classified elsewhere: Secondary | ICD-10-CM | POA: Diagnosis not present

## 2022-01-15 DIAGNOSIS — G4489 Other headache syndrome: Secondary | ICD-10-CM | POA: Diagnosis not present

## 2022-01-15 DIAGNOSIS — J029 Acute pharyngitis, unspecified: Secondary | ICD-10-CM | POA: Diagnosis not present

## 2022-01-15 DIAGNOSIS — R0981 Nasal congestion: Secondary | ICD-10-CM | POA: Diagnosis not present

## 2022-02-14 DIAGNOSIS — F900 Attention-deficit hyperactivity disorder, predominantly inattentive type: Secondary | ICD-10-CM | POA: Diagnosis not present

## 2022-02-14 DIAGNOSIS — E049 Nontoxic goiter, unspecified: Secondary | ICD-10-CM | POA: Diagnosis not present

## 2022-02-14 DIAGNOSIS — F411 Generalized anxiety disorder: Secondary | ICD-10-CM | POA: Diagnosis not present

## 2022-02-14 DIAGNOSIS — Z0000 Encounter for general adult medical examination without abnormal findings: Secondary | ICD-10-CM | POA: Diagnosis not present

## 2022-02-14 DIAGNOSIS — Z136 Encounter for screening for cardiovascular disorders: Secondary | ICD-10-CM | POA: Diagnosis not present

## 2022-02-14 DIAGNOSIS — Z23 Encounter for immunization: Secondary | ICD-10-CM | POA: Diagnosis not present

## 2022-02-24 DIAGNOSIS — E041 Nontoxic single thyroid nodule: Secondary | ICD-10-CM | POA: Diagnosis not present

## 2022-02-24 DIAGNOSIS — E049 Nontoxic goiter, unspecified: Secondary | ICD-10-CM | POA: Diagnosis not present

## 2022-03-15 DIAGNOSIS — Z3201 Encounter for pregnancy test, result positive: Secondary | ICD-10-CM | POA: Diagnosis not present

## 2022-03-15 DIAGNOSIS — Z3689 Encounter for other specified antenatal screening: Secondary | ICD-10-CM | POA: Diagnosis not present

## 2022-03-15 DIAGNOSIS — D219 Benign neoplasm of connective and other soft tissue, unspecified: Secondary | ICD-10-CM | POA: Diagnosis not present

## 2022-03-15 DIAGNOSIS — N83201 Unspecified ovarian cyst, right side: Secondary | ICD-10-CM | POA: Diagnosis not present
